# Patient Record
Sex: Female | Born: 1935 | Race: White | Hispanic: No | Marital: Married | Smoking: Former smoker
Health system: Southern US, Community
[De-identification: ages and names within clinical notes are randomized; demographics above are authoritative.]

## PROBLEM LIST (undated history)

## (undated) DIAGNOSIS — K449 Diaphragmatic hernia without obstruction or gangrene: Secondary | ICD-10-CM

## (undated) DIAGNOSIS — K219 Gastro-esophageal reflux disease without esophagitis: Secondary | ICD-10-CM

## (undated) DIAGNOSIS — K589 Irritable bowel syndrome without diarrhea: Secondary | ICD-10-CM

## (undated) DIAGNOSIS — G4700 Insomnia, unspecified: Secondary | ICD-10-CM

## (undated) DIAGNOSIS — E042 Nontoxic multinodular goiter: Secondary | ICD-10-CM

## (undated) DIAGNOSIS — M47816 Spondylosis without myelopathy or radiculopathy, lumbar region: Secondary | ICD-10-CM

## (undated) HISTORY — DX: Nontoxic multinodular goiter: E04.2

## (undated) HISTORY — PX: TONSILLECTOMY AND ADENOIDECTOMY: SUR1326

## (undated) HISTORY — DX: Spondylosis without myelopathy or radiculopathy, lumbar region: M47.816

## (undated) HISTORY — PX: FOOT SURGERY: SHX648

## (undated) HISTORY — PX: SHOULDER SURGERY: SHX246

## (undated) HISTORY — PX: VESICOVAGINAL FISTULA CLOSURE W/ TAH: SUR271

## (undated) HISTORY — PX: APPENDECTOMY: SHX54

## (undated) HISTORY — DX: Insomnia, unspecified: G47.00

## (undated) HISTORY — DX: Irritable bowel syndrome without diarrhea: K58.9

## (undated) HISTORY — DX: Diaphragmatic hernia without obstruction or gangrene: K44.9

## (undated) HISTORY — DX: Gastro-esophageal reflux disease without esophagitis: K21.9

## (undated) HISTORY — PX: CHOLECYSTECTOMY: SHX55

## (undated) HISTORY — PX: BILATERAL SALPINGOOPHORECTOMY: SHX1223

---

## 1999-05-25 ENCOUNTER — Encounter: Admission: RE | Admit: 1999-05-25 | Discharge: 1999-08-23 | Payer: Self-pay | Admitting: Orthopedic Surgery

## 1999-07-08 ENCOUNTER — Encounter: Payer: Self-pay | Admitting: Orthopedic Surgery

## 1999-07-08 ENCOUNTER — Encounter: Admission: RE | Admit: 1999-07-08 | Discharge: 1999-07-08 | Payer: Self-pay | Admitting: Orthopedic Surgery

## 1999-09-28 ENCOUNTER — Encounter: Admission: RE | Admit: 1999-09-28 | Discharge: 1999-11-10 | Payer: Self-pay | Admitting: Orthopedic Surgery

## 2001-05-10 ENCOUNTER — Encounter: Admission: RE | Admit: 2001-05-10 | Discharge: 2001-05-10 | Payer: Self-pay | Admitting: Family Medicine

## 2001-05-10 ENCOUNTER — Encounter: Payer: Self-pay | Admitting: Family Medicine

## 2001-10-02 ENCOUNTER — Ambulatory Visit (HOSPITAL_COMMUNITY): Admission: RE | Admit: 2001-10-02 | Discharge: 2001-10-02 | Payer: Self-pay | Admitting: Gastroenterology

## 2002-03-16 ENCOUNTER — Emergency Department (HOSPITAL_COMMUNITY): Admission: EM | Admit: 2002-03-16 | Discharge: 2002-03-16 | Payer: Self-pay | Admitting: Emergency Medicine

## 2002-03-16 ENCOUNTER — Encounter: Payer: Self-pay | Admitting: Emergency Medicine

## 2002-03-20 ENCOUNTER — Encounter: Payer: Self-pay | Admitting: Surgery

## 2002-03-26 ENCOUNTER — Encounter: Payer: Self-pay | Admitting: Surgery

## 2002-03-26 ENCOUNTER — Ambulatory Visit (HOSPITAL_COMMUNITY): Admission: RE | Admit: 2002-03-26 | Discharge: 2002-03-27 | Payer: Self-pay | Admitting: Surgery

## 2002-03-26 ENCOUNTER — Encounter (INDEPENDENT_AMBULATORY_CARE_PROVIDER_SITE_OTHER): Payer: Self-pay | Admitting: *Deleted

## 2002-06-08 ENCOUNTER — Encounter: Payer: Self-pay | Admitting: Orthopedic Surgery

## 2002-06-08 ENCOUNTER — Ambulatory Visit (HOSPITAL_COMMUNITY): Admission: RE | Admit: 2002-06-08 | Discharge: 2002-06-08 | Payer: Self-pay | Admitting: Orthopedic Surgery

## 2003-01-31 ENCOUNTER — Encounter: Admission: RE | Admit: 2003-01-31 | Discharge: 2003-01-31 | Payer: Self-pay | Admitting: Internal Medicine

## 2003-02-27 ENCOUNTER — Encounter: Admission: RE | Admit: 2003-02-27 | Discharge: 2003-03-11 | Payer: Self-pay | Admitting: Neurosurgery

## 2006-01-03 ENCOUNTER — Encounter: Admission: RE | Admit: 2006-01-03 | Discharge: 2006-04-03 | Payer: Self-pay | Admitting: Family Medicine

## 2006-01-16 ENCOUNTER — Emergency Department (HOSPITAL_COMMUNITY): Admission: EM | Admit: 2006-01-16 | Discharge: 2006-01-16 | Payer: Self-pay | Admitting: Emergency Medicine

## 2009-10-07 ENCOUNTER — Encounter: Payer: Self-pay | Admitting: Pulmonary Disease

## 2009-10-21 ENCOUNTER — Encounter: Admission: RE | Admit: 2009-10-21 | Discharge: 2009-10-21 | Payer: Self-pay | Admitting: Family Medicine

## 2009-10-30 DIAGNOSIS — M5430 Sciatica, unspecified side: Secondary | ICD-10-CM | POA: Insufficient documentation

## 2009-10-30 DIAGNOSIS — K449 Diaphragmatic hernia without obstruction or gangrene: Secondary | ICD-10-CM | POA: Insufficient documentation

## 2009-10-30 DIAGNOSIS — M545 Low back pain, unspecified: Secondary | ICD-10-CM | POA: Insufficient documentation

## 2009-10-30 DIAGNOSIS — J309 Allergic rhinitis, unspecified: Secondary | ICD-10-CM | POA: Insufficient documentation

## 2009-10-30 DIAGNOSIS — G4700 Insomnia, unspecified: Secondary | ICD-10-CM | POA: Insufficient documentation

## 2009-10-30 DIAGNOSIS — K589 Irritable bowel syndrome without diarrhea: Secondary | ICD-10-CM | POA: Insufficient documentation

## 2009-10-30 DIAGNOSIS — J45909 Unspecified asthma, uncomplicated: Secondary | ICD-10-CM | POA: Insufficient documentation

## 2009-10-30 DIAGNOSIS — K591 Functional diarrhea: Secondary | ICD-10-CM | POA: Insufficient documentation

## 2009-10-30 DIAGNOSIS — E559 Vitamin D deficiency, unspecified: Secondary | ICD-10-CM | POA: Insufficient documentation

## 2009-10-30 DIAGNOSIS — K219 Gastro-esophageal reflux disease without esophagitis: Secondary | ICD-10-CM | POA: Insufficient documentation

## 2009-10-30 DIAGNOSIS — K224 Dyskinesia of esophagus: Secondary | ICD-10-CM | POA: Insufficient documentation

## 2009-10-31 ENCOUNTER — Ambulatory Visit: Payer: Self-pay | Admitting: Pulmonary Disease

## 2009-10-31 DIAGNOSIS — E041 Nontoxic single thyroid nodule: Secondary | ICD-10-CM | POA: Insufficient documentation

## 2009-10-31 LAB — CONVERTED CEMR LAB
BUN: 19 mg/dL (ref 6–23)
CO2: 32 meq/L (ref 19–32)
Calcium: 9.4 mg/dL (ref 8.4–10.5)
GFR calc non Af Amer: 62.39 mL/min (ref >60)
Glucose, Bld: 126 mg/dL — ABNORMAL HIGH (ref 70–99)
Potassium: 4.1 meq/L (ref 3.5–5.1)

## 2009-11-03 ENCOUNTER — Ambulatory Visit: Payer: Self-pay | Admitting: Cardiology

## 2010-04-15 ENCOUNTER — Other Ambulatory Visit: Payer: Self-pay | Admitting: Pulmonary Disease

## 2010-04-15 DIAGNOSIS — R918 Other nonspecific abnormal finding of lung field: Secondary | ICD-10-CM

## 2010-04-21 NOTE — Assessment & Plan Note (Signed)
Summary: pulm nodule/apc   Visit Type:  Initial Consult Copy to:  Dr. Catha Gosselin Primary Provider/Referring Provider:  Dr. Catha Gosselin  CC:  Pulmonary consult for abnormal CT chest. Pulmonary nodules seen on scan.Marland Kitchen  History of Present Illness: 77 yo female with abnormal CT chest.  She was visiting her daughter in [place] in June.  She fell while there, and as a result was taken to [facility].  During that evaluation she was found to have a lung nodule.  She was advised to have further evaluation at that time, but deferred until she was able to return to [place].  She was also found to have multiple thyroid nodules.  She has a history of asthma and allergies.  She had an asthma attack 8 years ago while in [place].  Otherwise she does not currently have any symptoms of cough, wheeze, sputum, hemoptysis, fever, chest pain, or palpitations.  She uses flonase, and this has controlled her allergy symptoms.  She has trouble with her allergies in the Spring and Fall.  She will sometimes get wheeze with a flare of her allergies.  She will get welts if she takes penicillin.  There is no prior history of pneumonia, and she only seldomly gets bronchitis.  There is no history of TB.  She smoked less than a pack of cigarettes for about 10 years, and quit in 1962.  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  She travelled to the [place] last year, but had no trouble.  She has not had any recent sick exposures.  She is originally from [place], but has been living in [place] for 22 years.  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  CT chest from June 2011 showed left upper lobe cavitary nodule 10 mm x 10 mm, and several areas of ground glass attentuation.  Preventive Screening-Counseling & Management  Alcohol-Tobacco     Alcohol drinks/day: <1     Alcohol type: wine     Smoking Status: quit     Packs/Day: 0.75   Year Started: 1952     Year Quit: 1968     Pack years: 12  Current Medications (verified): 1)  Vitamin D 1000 Unit Tabs (Cholecalciferol) .... 2 By Mouth Daily 2)  Fexofenadine Hcl 180 Mg Tabs (Fexofenadine Hcl) .... Take 1 Tablet By Mouth Once A Day 3)  Fluticasone Propionate 50 Mcg/act Susp (Fluticasone Propionate) .... 2 Sprays Each Nostril Dialy 4)  Cholestyramine 4 Gm Pack (Cholestyramine) .... Mix 1 Packet in Beverage and Drink Once Daily Per Dr. Randa Evens 5)  Calcium 1500 Mg Tabs (Calcium Carbonate) .Marland Kitchen.. 1 By Mouth Daily 6)  Multivitamins  Tabs (Multiple Vitamin) .Marland Kitchen.. 1 By Mouth Daily 7)  Restoril 15 Mg Caps (Temazepam) .Marland Kitchen.. 1 By Mouth At Bedtime As Needed 8)  Aleve 220 Mg Tabs (Naproxen Sodium) .... As Needed  Allergies (verified): 1)  ! Codeine 2)  ! Penicillin 3)  ! Jonne Ply  Past History:  Past Medical History: Asthma Allergic rhinitis Irritable bowel syndrome GERD HIatal hernia Lumbar spine spondylosis Insomnia Multinodular thyroid Vitamin D Deficiency Pulmonary nodule Lt upper lobe      - CT chest June 2011     Past Surgical History: Appendectomy Cholecystectomy Tonsillectomy and adenoidectomy Right foot surgery BSO for fibroids Benign breast biopsy x2 Shoulder surgery Hysterectomy  Family History: Family History Emphysema ---father and brother  Social History: former smoker.  social alcohol retired Runner, broadcasting/film/video married 3 childrenAlcohol drinks/day:  <1 Smoking Status:  quit Packs/Day:  0.75 Pack years:  12  Review of Systems  The patient denies shortness of breath with activity, shortness of breath at rest, productive cough, non-productive cough, coughing up blood, chest pain, irregular heartbeats, acid heartburn, indigestion, loss of appetite, weight change, abdominal pain, difficulty swallowing, sore throat, tooth/dental problems, headaches, nasal congestion/difficulty breathing through nose, sneezing, itching, ear ache, anxiety, depression, hand/feet  swelling, joint stiffness or pain, rash, change in color of mucus, and fever.    Vital Signs:  Patient profile:   77 year old female Height:      65 inches (165.10 cm) Weight:      178 pounds (80.91 kg) BMI:     29.73 O2 Sat:      97 % on Room air Temp:     98.5 degrees F (36.94 degrees C) p Pulse rate:   91 / minute BP sitting:   142 / 90  (right arm) Cuff size:   regular  Vitals Entered By: Michel Bickers CMA (October 31, 2009 2:38 PM)  O2 Sat at Rest %:  97 O2 Flow:  Room air CC: Pulmonary consult for abnormal CT chest. Pulmonary nodules seen on scan. Is Patient Diabetic? No Comments Medications reviewed with the patient. Daytime phone verified. Michel Bickers [facility]  October 31, 2009 2:39 PM   Physical Exam  General:  normal appearance and healthy appearing.   Eyes:  PERRLA and EOMI, wears glasses Nose:  no deformity, discharge, inflammation, or lesions Mouth:  no deformity or lesions Neck:  no JVD.   Chest Wall:  no deformities noted Lungs:  clear bilaterally to auscultation and percussion Heart:  regular rate and rhythm, S1, S2 without murmurs, rubs, gallops, or clicks Abdomen:  bowel sounds positive; abdomen soft and non-tender without masses, or organomegaly Msk:  no deformity or scoliosis noted with normal posture Pulses:  pulses normal Extremities:  no clubbing, cyanosis, edema, or deformity noted Neurologic:  CN II-XII grossly intact with normal reflexes, coordination, muscle strength and tone Cervical Nodes:  no significant adenopathy Axillary Nodes:  no significant adenopathy   Impression & Recommendations:  Problem # 1:  CT, CHEST, ABNORMAL (ICD-793.1) She had incidental finding of multiple nodules and left upper lobe cavitary lesion on CT chest from June 2011 while in [place].  She has no respiratory symptoms to speak of.  She has very remote history of tobacco use.  I will repeat her CT chest with contrast.  Will call her with the results.  Explained that if her  findings persist or have progressed, that she will need to undergo lung tissue and airway sampling.  Problem # 2:  THYROID NODULE (ICD-241.0) She is to have further evaluation of this.  Medications Added to Medication List This Visit: 1)  Restoril 15 Mg Caps (Temazepam) .Marland Kitchen.. 1 by mouth at bedtime as needed 2)  Vitamin D 1000 Unit Tabs (Cholecalciferol) .... 2 by mouth daily 3)  Calcium 1500 Mg Tabs (Calcium carbonate) .Marland Kitchen.. 1 by mouth daily 4)  Multivitamins Tabs (Multiple vitamin) .Marland Kitchen.. 1 by mouth daily 5)  Aleve 220 Mg Tabs (Naproxen sodium) .... As needed  Complete Medication List: 1)  Fexofenadine Hcl 180 Mg Tabs (Fexofenadine hcl) .... Take 1 tablet by mouth once a day 2)  Fluticasone Propionate 50 Mcg/act Susp (Fluticasone propionate) .... 2 sprays each nostril dialy 3)  Cholestyramine 4 Gm Pack (Cholestyramine) .... Mix 1 packet in beverage and  drink once daily per dr. Randa Evens 4)  Restoril 15 Mg Caps (Temazepam) .Marland Kitchen.. 1 by mouth at bedtime as needed 5)  Vitamin D 1000 Unit Tabs (Cholecalciferol) .... 2 by mouth daily 6)  Calcium 1500 Mg Tabs (Calcium carbonate) .Marland Kitchen.. 1 by mouth daily 7)  Multivitamins Tabs (Multiple vitamin) .Marland Kitchen.. 1 by mouth daily 8)  Aleve 220 Mg Tabs (Naproxen sodium) .... As needed  Other Orders: Consultation Level IV (04540) Radiology Referral (Radiology) TLB-BMP (Basic Metabolic Panel-BMET) (80048-METABOL)  Patient Instructions: 1)  Will schedule CT chest 2)  Follow up in 2 weeks

## 2010-04-21 NOTE — Letter (Signed)
Summary: [facility]   Imported By: Sherian Rein 11/17/2009 11:39:30  _____________________________________________________________________  External Attachment:    Type:   Image     Comment:   External Document

## 2010-05-07 ENCOUNTER — Other Ambulatory Visit: Payer: Self-pay | Admitting: Internal Medicine

## 2010-05-07 DIAGNOSIS — E042 Nontoxic multinodular goiter: Secondary | ICD-10-CM

## 2010-05-11 ENCOUNTER — Ambulatory Visit
Admission: RE | Admit: 2010-05-11 | Discharge: 2010-05-11 | Payer: Self-pay | Source: Ambulatory Visit | Attending: Internal Medicine | Admitting: Internal Medicine

## 2010-05-11 ENCOUNTER — Ambulatory Visit (INDEPENDENT_AMBULATORY_CARE_PROVIDER_SITE_OTHER)
Admission: RE | Admit: 2010-05-11 | Discharge: 2010-05-11 | Disposition: A | Discharge status: Home / Self Care | Payer: Medicare Other | Source: Ambulatory Visit | Attending: Pulmonary Disease | Admitting: Pulmonary Disease

## 2010-05-11 DIAGNOSIS — R918 Other nonspecific abnormal finding of lung field: Secondary | ICD-10-CM

## 2010-05-20 ENCOUNTER — Encounter: Payer: Self-pay | Admitting: Pulmonary Disease

## 2010-05-20 ENCOUNTER — Other Ambulatory Visit: Payer: Self-pay | Admitting: Pulmonary Disease

## 2010-05-20 ENCOUNTER — Ambulatory Visit (INDEPENDENT_AMBULATORY_CARE_PROVIDER_SITE_OTHER): Payer: Medicare Other | Admitting: Pulmonary Disease

## 2010-05-20 DIAGNOSIS — J984 Other disorders of lung: Secondary | ICD-10-CM | POA: Insufficient documentation

## 2010-05-20 DIAGNOSIS — R911 Solitary pulmonary nodule: Secondary | ICD-10-CM

## 2010-05-28 NOTE — Assessment & Plan Note (Signed)
Summary: rov/ct cxr/kp   Visit Type:  Follow-up Copy to:  Dr. Catha Gosselin Primary Provider/Referring Provider:  Dr. Catha Gosselin  CC:  follow up. Pt here to go over ct results. Pt has no concerns today.  History of Present Illness: 77 yo female with pulmonary nodule.  She has more allergy symptoms which is typical for this time of year.  She denies cough, hemoptysis, fever, weight loss, sweats, or gland swelling.  CT chest from May 11, 2010 as detailed below.  CT of Chest  Procedure date:  05/11/2010  Findings:      CT CHEST WITHOUT CONTRAST   Technique:  Multidetector CT imaging of the chest was performed following the standard protocol without IV contrast.   Comparison: 11/03/2009   Findings: 0.9 cm pretracheal node is stable.  Heart size is normal. No pericardial or pleural effusion.  Great vessels are normal in caliber.  No axillary lymphadenopathy.   Right apical nodule is larger, now 1.1 cm on image 6, previously 0.6 cm.   Partially cavitary left upper lobe nodule now measures 1.0 x 0.9 cm on image 8, unchanged.   Mild emphysematous changes are noted.   Ground-glass airspace opacity nodule the right middle lobe measures 0.6 cm on image 26, possibly smaller than previously, although this may be in part due to differences in technique.   New patchy areas of subpleural nodularity are noted in the superior segments of the bilateral lower lobes, for example image 29.  Sub centimeter right lower lobe ground-glass airspace opacity nodule image 34 is stable.   Other scattered predominately subpleural and/or peripheral areas of ill-defined sub centimeter nodularity with surrounding ground-glass opacity are again noted.  One area of nodularity in the left lower lobe measuring 0.5 cm on image 48 is new or increased.   No lytic or sclerotic osseous lesion.  No new osseous abnormality.   IMPRESSION: Increase in size of irregular right upper lobe/apical  pulmonary nodular opacity.  Overall, there are other areas of predominately subpleural nodularity that are new and increased since the prior exam, many of which demonstrate surrounding ground-glass opacity. This suggests etiologies such as infection, septic emboli, or vasculitis.  However, the increase in size of the right apical nodule is also concerning for malignancy.  PET CT is recommended for further evaluation.   Current Medications (verified): 1)  Fexofenadine Hcl 180 Mg Tabs (Fexofenadine Hcl) .... Take 1 Tablet By Mouth Once A Day 2)  Fluticasone Propionate 50 Mcg/act Susp (Fluticasone Propionate) .... 2 Sprays Each Nostril Dialy 3)  Cholestyramine 4 Gm Pack (Cholestyramine) .... Mix 1 Packet in Beverage and Drink Once Daily Per Dr. Randa Evens 4)  Vitamin D 1000 Unit Tabs (Cholecalciferol) .... 2 By Mouth Daily 5)  Calcium 1500 Mg Tabs (Calcium Carbonate) .Marland Kitchen.. 1 By Mouth Daily 6)  Multivitamins  Tabs (Multiple Vitamin) .Marland Kitchen.. 1 By Mouth Daily 7)  Aleve 220 Mg Tabs (Naproxen Sodium) .... As Needed  Allergies (verified): 1)  ! Codeine 2)  ! Penicillin 3)  ! Jonne Ply  Past History:  Past Medical History: Asthma Allergic rhinitis Irritable bowel syndrome GERD HIatal hernia Lumbar spine spondylosis Insomnia Multinodular thyroid Vitamin D Deficiency Pulmonary nodules      - CT chest June 2011, August 2011, February 2012      Past Surgical History: Reviewed history from 10/31/2009 and no changes required. Appendectomy Cholecystectomy Tonsillectomy and adenoidectomy Right foot surgery BSO for fibroids Benign breast biopsy x2 Shoulder surgery Hysterectomy  Social History: former smoker. Quit  37.  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 Pt here to go over ct results. Pt has no concerns today Comments meds and allergies updated Phone number updated Carver Fila  May 20, 2010 9:50 AM    Physical Exam  General:  normal appearance and healthy appearing.   Nose:  no deformity, discharge, inflammation, or lesions Mouth:  no deformity or lesions Neck:  no JVD.   Lungs:  clear bilaterally to auscultation and percussion Heart:  regular rate and rhythm, S1, S2 without murmurs, rubs, gallops, or clicks Extremities:  no clubbing, cyanosis, edema, or deformity noted Neurologic:  normal CN II-XII.   Cervical Nodes:  no significant adenopathy   Impression & Recommendations:  Problem # 1:  PULMONARY NODULE (ICD-518.89) She has progression of right upper lobe nodule.  Will arrange for PET scan to further assess.  Depending on results will decide if she needs continue radiographic observation vs. biopsy attempt.  Problem # 2:  ALLERGIC RHINITIS CAUSE UNSPECIFIED (ICD-477.9) She is to follow up with primary care.  Complete Medication List: 1)  Fexofenadine Hcl 180 Mg Tabs (Fexofenadine hcl) .... Take 1 tablet by mouth once a day 2)  Fluticasone Propionate 50 Mcg/act Susp (Fluticasone propionate) .... 2 sprays each nostril dialy 3)  Cholestyramine 4 Gm Pack (Cholestyramine) .... Mix 1 packet in beverage and drink once daily per dr. Randa Evens 4)  Vitamin D 1000 Unit Tabs (Cholecalciferol) .... 2 by mouth daily 5)  Calcium 1500 Mg Tabs (Calcium carbonate) .Marland Kitchen.. 1 by mouth daily 6)  Multivitamins Tabs (Multiple vitamin) .Marland Kitchen.. 1 by mouth daily 7)  Aleve 220 Mg Tabs (Naproxen sodium) .... As needed  Other Orders: Est. Patient Level IV (53664) Radiology Referral (Radiology)  Patient Instructions: 1)  Will schedule PET scan; will call with results 2)  Follow up in 3  months   Immunization History:  Influenza Immunization History:    Influenza:  historical (11/20/2009)  Pneumovax Immunization History:    Pneumovax:  historical (12/21/2003)

## 2010-06-02 ENCOUNTER — Encounter (HOSPITAL_COMMUNITY)
Admission: RE | Admit: 2010-06-02 | Discharge: 2010-06-02 | Disposition: A | Discharge status: Home / Self Care | Payer: Medicare Other | Source: Ambulatory Visit | Attending: Pulmonary Disease | Admitting: Pulmonary Disease

## 2010-06-02 DIAGNOSIS — R911 Solitary pulmonary nodule: Secondary | ICD-10-CM

## 2010-06-02 DIAGNOSIS — J984 Other disorders of lung: Secondary | ICD-10-CM | POA: Insufficient documentation

## 2010-06-02 MED ORDER — FLUDEOXYGLUCOSE F - 18 (FDG) INJECTION
16.1000 | Freq: Once | INTRAVENOUS | Status: AC | PRN
  Administered 2010-06-02: 16.1 via INTRAVENOUS

## 2010-06-05 ENCOUNTER — Other Ambulatory Visit: Payer: Self-pay | Admitting: Pulmonary Disease

## 2010-06-05 DIAGNOSIS — R911 Solitary pulmonary nodule: Secondary | ICD-10-CM

## 2010-08-07 NOTE — Op Note (Signed)
[place]  Patient:    Kristen Hart, Kristen Hart Visit Number: [id number] MRN: [id number]          Service Type: END Location: ENDO Attending Physician:  Orland Mustard Dictated by:   Llana Aliment. Randa Evens, M.D. Proc. Date: 10/02/01 Admit Date:  10/02/2001   CC:         Caryn Bee C. Sydnee Levans, M.D.   Operative Report  DATE OF BIRTH:  05-13-1927  PROCEDURE:  Colonoscopy.  MEDICATIONS:  Fentanyl 100 mcg, Versed 7 mg IV.  INDICATIONS:  Colon cancer screening.  INSTRUMENT:  Pediatric video colonoscope.  DESCRIPTION OF PROCEDURE:  The patient had the procedure explained to them and consent obtained. The patient in the left lateral decubitus position. The scope was inserted under direct visualization. There was extensive diverticula disease in the sigmoid colon. Multiple maneuvers were required to pass this. Eventually, we were able to pass it. The patient had had a brief period of abdominal pain and a "passing out spell" at home during the prep. We had checked labs in the endoscopic unit and CBC and BMET were all normal. Once we were able to pass this, we were able to advance fairly rapidly to the cecum. The ileocecal valve and appendiceal orifice were seen. The scope was withdrawn and the cecum, ascending colon, hepatic flexure, transverse colon, splenic flexure, descending and sigmoid colon were seen well upon removal. No polyps were seen throughout. Again, extensive diverticular disease seen in the sigmoid colon. The rectum was free of polyps. The scope was withdrawn. The patient tolerated the procedure well, was maintained on low-flow oxygen and pulse oximeter throughout the procedure.  ASSESSMENT: Severe diverticulosis of the sigmoid colon, probably the cause of her cramping during the prep.  PLAN:  Routine follow up with yearly Hemoccults. Consider another colon screen of some sort in 5-10 years. Will give information about diverticular  disease, fiber supplements, etc. Dictated by:   Llana Aliment. Randa Evens, M.D. Attending Physician:  Orland Mustard DD:  10/02/01 TD:  10/03/01 Job: [id number]

## 2010-08-07 NOTE — Op Note (Signed)
NAME:  Kristen Hart, Kristen Hart NO.:  [id number]   MEDICAL RECORD NO.:  [id number]                    PATIENT TYPE:   LOCATION:                                       FACILITY:   PHYSICIAN:  Thornton Park. Daphine Deutscher, M.D.             DATE OF BIRTH:  11/06/1927   DATE OF PROCEDURE:  03/26/2002  DATE OF DISCHARGE:                                 OPERATIVE REPORT   PREOPERATIVE DIAGNOSIS:  Large gallstone with chronic cholecystitis.   POSTOPERATIVE DIAGNOSIS:  Large gallstone with subacute and chronic  cholecystitis.   PROCEDURE:  Laparoscopic cholecystectomy with intraoperative cholangiogram.   SURGEON:  Thornton Park. Daphine Deutscher, M.D.   ASSISTANT:  Sandria Bales. Ezzard Standing, M.D.   ANESTHESIA:  General endotracheal anesthesia.   DESCRIPTION OF PROCEDURE:  The patient is a 77 year-old lady with the  aforementioned problems. She was taken back to room 16 and given general  anesthesia.  The abdomen was prepped with Betadine and draped sterilely. A  longitudinal incision was made down into her umbilicus and through  pursestring suture the Hasson cannula was passed  and the abdomen was  insufflated.  Three trocars were placed in the upper abdomen.  The  gallbladder was decompressed because it was so distended and the bile was  sent for culture and gram stain.  The gallbladder was really stuck  particularly down near the infundibulum and this was stripped away with  blunt dissection using the dissector and the suction irrigator.  I then  dissected free the cystic duct and put a clamp upon the gallbladder side,  opened it and inserted a Reddick catheter.  It went in 2 cm into what  appeared to be the common duct where I blew up the balloon and pulled it  back.  Dynamic cholangiogram confirmed that the balloon was in the common  duct and I got preferential flow upstream and then when I pulled it back a  little bit with the balloon inflated I got good downstream filling as well.  This  corroborated the clinical impression of the length of the cystic duct  and I went ahead and then removed the catheter, tripped clipped it and then  divided it.  The cystic artery was divided after clipping and then the  gallbladder was removed with some bluntness and then with the Bovie from a  very intrahepatic position and from a very inflamed position chronically.  The gallbladder when once detached and was then placed in a bag and brought  out through the umbilicus.  In the meantime, I inspected the gallbladder bed  and no bleeding or bowel leaks were seen.  I had to enlarge both the fascial  and skin incisions to remove this probably greater than one and a half inch  gallstone.  The umbilical defect was then repaired with interrupted sutures  of 0 Prolene.  The wounds were irrigated.  They had been injected with  some  lidocaine/Marcaine and once the instillation had occurred the skin was  closed with 4-0 Vicryl.  Prior to removing, I did inspect the closure from  inside with the  scope and there was a good secure closure without impinging on any other  structures.  The other port sites were removed and the abdomen was deflated.  The patient seemed to tolerate this procedure well.  She was taken to the  recovery room in satisfactory condition prior to admission for observation.                                               Thornton Park Daphine Deutscher, M.D.    MBM/MEDQ  D:  03/26/2002  T:  03/26/2002  Job:  [id number]   cc:   Fayrene Fearing L. Malon Kindle., M.D.  [address], [address]  [place]  [place] [address]  Fax: [phone]

## 2010-09-17 ENCOUNTER — Other Ambulatory Visit: Payer: Self-pay | Admitting: Internal Medicine

## 2010-09-17 DIAGNOSIS — E042 Nontoxic multinodular goiter: Secondary | ICD-10-CM

## 2010-09-28 ENCOUNTER — Ambulatory Visit
Admission: RE | Admit: 2010-09-28 | Discharge: 2010-09-28 | Disposition: A | Discharge status: Home / Self Care | Payer: Medicare Other | Source: Ambulatory Visit | Attending: Internal Medicine | Admitting: Internal Medicine

## 2010-09-28 DIAGNOSIS — E042 Nontoxic multinodular goiter: Secondary | ICD-10-CM

## 2010-10-07 ENCOUNTER — Ambulatory Visit (INDEPENDENT_AMBULATORY_CARE_PROVIDER_SITE_OTHER)
Admission: RE | Admit: 2010-10-07 | Discharge: 2010-10-07 | Disposition: A | Discharge status: Home / Self Care | Payer: Medicare Other | Source: Ambulatory Visit | Attending: Pulmonary Disease | Admitting: Pulmonary Disease

## 2010-10-07 DIAGNOSIS — J984 Other disorders of lung: Secondary | ICD-10-CM

## 2010-10-07 DIAGNOSIS — R911 Solitary pulmonary nodule: Secondary | ICD-10-CM

## 2010-10-13 ENCOUNTER — Telehealth: Payer: Self-pay | Admitting: Pulmonary Disease

## 2010-10-13 DIAGNOSIS — J984 Other disorders of lung: Secondary | ICD-10-CM

## 2010-10-13 NOTE — Telephone Encounter (Signed)
CT chest 10/07/10>>Decreased size LUL nodule, scattered GGO stable.  Will have my nurse schedule ROV to review CT chest results.

## 2010-10-20 NOTE — Telephone Encounter (Signed)
Pt is coming in 8/6 at 1:30 to discuss results

## 2010-10-26 ENCOUNTER — Ambulatory Visit (INDEPENDENT_AMBULATORY_CARE_PROVIDER_SITE_OTHER): Payer: Medicare Other | Admitting: Pulmonary Disease

## 2010-10-26 ENCOUNTER — Encounter: Payer: Self-pay | Admitting: Pulmonary Disease

## 2010-10-26 VITALS — BP 114/78 | HR 81 | Temp 98.6°F | Ht 66.0 in | Wt 186.0 lb

## 2010-10-26 DIAGNOSIS — J984 Other disorders of lung: Secondary | ICD-10-CM

## 2010-10-26 NOTE — Progress Notes (Signed)
  Subjective:    Patient ID: Kristen Hart, female    DOB: March 18, 1928, 77 y.o.   MRN: [id number]  HPI 77 yo female with remote history of smoking with pulmonary nodule.  She had CT chest on October 07, 2010.  She has been doing well.  She denies cough, wheeze, sputum, chest pain, fever, hemoptysis, sore throat, difficulty swallowing, or gland swelling.  Review of Systems     Objective:   Physical Exam  BP 114/78  Pulse 81  Temp(Src) 98.6 F (37 C) (Oral)  Ht 5\' 6"  (1.676 m)  Wt 186 lb (84.369 kg)  BMI 30.02 kg/m2  SpO2 96%  General - Healthy HEENT - no sinus tenderness, no oral lesion, no LAN Cardiac - s1s2 regular Chest - CTA Abd - soft, nontender Ext - no edema Neuro - normal strength, CN intact Psych - normal mood, behavior   CT CHEST WITHOUT CONTRAST 10/07/10:  Technique: Multidetector CT imaging of the chest was performed  following the standard protocol without IV contrast.   Comparison: PET CT 06/02/2010, CT chest 05/11/2010 and CT chest  11/03/2009.   Findings: The thyroid is somewhat heterogeneous in attenuation. A  lymph node anterior to the right mainstem bronchus measures 11 mm,  and is likely stable. Hilar regions are difficult to definitively  evaluate without IV contrast. No axillary adenopathy.  Atherosclerotic calcification of the arterial vasculature. Heart  size normal. No pericardial effusion.  A nodular lesion at the apex of the right upper lobe has associated  calcification and fat density within, indicative of scarring. A  tiny nodule at the apex of the left upper lobe measures 5 mm (image  6) and appears less prominent than on baseline examination of  11/03/2009. Scattered centrilobular emphysema. Scattered  peribronchovascular nodularity is seen bilaterally and is likely  post infectious in etiology. There are a few additional scattered  areas of nodularity, namely, in the posterior segment right upper  lobe (image 24), measuring 7  mm, stable from 11/03/2009. No  pleural fluid. Airway is unremarkable.  Incidental imaging of the upper abdomen shows no acute findings.  No worrisome lytic or sclerotic lesions. There are degenerative  changes in the spine.   IMPRESSION:  1. Nodular density at the apex of the right upper lobe has  associated calcification and fat, favoring scarring.  2. Nodular lesion at the apex of the left upper lobe is less  prominent than baseline examination of 11/03/2009.  3. Additional scattered areas of ground-glass nodularity appear  stable from 11/03/2009.  4. Additional follow-up could be performed in 12 months to ensure  stability, as clinically indicated.     Assessment & Plan:

## 2010-10-26 NOTE — Patient Instructions (Signed)
Will schedule CT chest for August 2013. Will follow up in August 2013 after CT chest is done.

## 2010-10-26 NOTE — Assessment & Plan Note (Signed)
She is followed for pulmonary nodules, with predominant lesions in Lt and Rt upper lobes.  This was first seen November 03, 2009.  This has been stable on CT chest.  Will repeat CT chest w/o contrast for August 2013.

## 2011-03-12 ENCOUNTER — Emergency Department (HOSPITAL_BASED_OUTPATIENT_CLINIC_OR_DEPARTMENT_OTHER)
Admission: EM | Admit: 2011-03-12 | Discharge: 2011-03-12 | Disposition: A | Discharge status: Home / Self Care | Payer: Medicare Other | Attending: Emergency Medicine | Admitting: Emergency Medicine

## 2011-03-12 ENCOUNTER — Emergency Department (INDEPENDENT_AMBULATORY_CARE_PROVIDER_SITE_OTHER): Payer: Medicare Other

## 2011-03-12 ENCOUNTER — Encounter (HOSPITAL_BASED_OUTPATIENT_CLINIC_OR_DEPARTMENT_OTHER): Payer: Self-pay | Admitting: Family Medicine

## 2011-03-12 DIAGNOSIS — S0100XA Unspecified open wound of scalp, initial encounter: Secondary | ICD-10-CM | POA: Insufficient documentation

## 2011-03-12 DIAGNOSIS — S0990XA Unspecified injury of head, initial encounter: Secondary | ICD-10-CM | POA: Insufficient documentation

## 2011-03-12 DIAGNOSIS — S0003XA Contusion of scalp, initial encounter: Secondary | ICD-10-CM

## 2011-03-12 DIAGNOSIS — G319 Degenerative disease of nervous system, unspecified: Secondary | ICD-10-CM | POA: Insufficient documentation

## 2011-03-12 DIAGNOSIS — S098XXA Other specified injuries of head, initial encounter: Secondary | ICD-10-CM

## 2011-03-12 DIAGNOSIS — K589 Irritable bowel syndrome without diarrhea: Secondary | ICD-10-CM | POA: Insufficient documentation

## 2011-03-12 DIAGNOSIS — S0190XA Unspecified open wound of unspecified part of head, initial encounter: Secondary | ICD-10-CM

## 2011-03-12 DIAGNOSIS — W1809XA Striking against other object with subsequent fall, initial encounter: Secondary | ICD-10-CM

## 2011-03-12 DIAGNOSIS — R51 Headache: Secondary | ICD-10-CM

## 2011-03-12 DIAGNOSIS — Y92009 Unspecified place in unspecified non-institutional (private) residence as the place of occurrence of the external cause: Secondary | ICD-10-CM | POA: Insufficient documentation

## 2011-03-12 DIAGNOSIS — S0101XA Laceration without foreign body of scalp, initial encounter: Secondary | ICD-10-CM

## 2011-03-12 DIAGNOSIS — Z79899 Other long term (current) drug therapy: Secondary | ICD-10-CM | POA: Insufficient documentation

## 2011-03-12 DIAGNOSIS — W19XXXA Unspecified fall, initial encounter: Secondary | ICD-10-CM | POA: Insufficient documentation

## 2011-03-12 DIAGNOSIS — J45909 Unspecified asthma, uncomplicated: Secondary | ICD-10-CM | POA: Insufficient documentation

## 2011-03-12 MED ORDER — HYDROCODONE-ACETAMINOPHEN 5-325 MG PO TABS
1.0000 | ORAL_TABLET | Freq: Once | ORAL | Status: AC
  Administered 2011-03-12: 1 via ORAL
  Filled 2011-03-12: qty 1

## 2011-03-12 MED ORDER — HYDROCODONE-ACETAMINOPHEN 5-500 MG PO TABS: 1.0000 | ORAL_TABLET | Freq: Every evening | ORAL | Status: AC | PRN

## 2011-03-12 MED ORDER — TETANUS-DIPHTH-ACELL PERTUSSIS 5-2.5-18.5 LF-MCG/0.5 IM SUSP
0.5000 mL | Freq: Once | INTRAMUSCULAR | Status: AC
  Administered 2011-03-12: 0.5 mL via INTRAMUSCULAR
  Filled 2011-03-12: qty 0.5

## 2011-03-12 NOTE — ED Provider Notes (Signed)
History     CSN: [id number]  Arrival date & time 03/12/11  0902   First MD Initiated Contact with Patient 03/12/11 [phone]      Chief Complaint  Patient presents with  . Fall  . Head Injury    (Consider location/radiation/quality/duration/timing/severity/associated sxs/prior treatment) HPI  83yof presents after fall. Patient states that she was involved in a relay activity today she fell forward and struck her head on the tile floor. She had approximately 1 minute of loss of consciousness. No confusion thereafter. No incontinence or seizure like activity. Denies headache, dizziness, cp, palpitations, shortness of breath pre fall and currently. No neck pain or back pain. Denies hip pain. C/O min bleeding from scalp laceration. Takes no anticoagulants.   ED Notes, ED Provider Notes from 03/12/11 0000 to 03/12/11 09:11:19       Vickie Boston Service, RN 03/12/2011 09:04      Pt from [facility]. Pt was in exercise class and "slipped" falling and hitting back of head. Bleeding controlled upon arrival. No Loc. Pt alert and oriented upon arrival.      Past Medical History  Diagnosis Date  . Asthma   . Allergic rhinitis   . IBS (irritable bowel syndrome)   . GERD (gastroesophageal reflux disease)   . Hiatal hernia   . Lumbar spondylosis   . Multinodular thyroid   . Insomnia     Past Surgical History  Procedure Date  . Appendectomy   . Cholecystectomy   . Tonsillectomy and adenoidectomy   . Foot surgery     right  . Bilateral salpingoophorectomy     fibroids  . Shoulder surgery   . Vesicovaginal fistula closure w/ tah     Family History  Problem Relation Age of Onset  . Emphysema Brother   . Emphysema Father     History  Substance Use Topics  . Smoking status: Former Smoker -- 1.0 packs/day for 8 years    Types: Cigarettes    Quit date: 03/23/1959  . Smokeless tobacco: Never Used  . Alcohol Use: Yes    OB History    Grav Para Term Preterm Abortions TAB SAB  Ect Mult Living                  Review of Systems  All other systems reviewed and are negative.   except as noted HPI  Allergies  Aspirin; Codeine; and Penicillins  Home Medications   Current Outpatient Rx  Name Route Sig Dispense Refill  . CALCIUM 1500 MG PO TABS Oral Take 1,500 mg by mouth daily.      Marland Kitchen VITAMIN D 1000 UNITS PO TABS Oral Take 2,000 Units by mouth daily.      . CHOLESTYRAMINE 4 G PO PACK Oral Take 1 packet by mouth daily.      Marland Kitchen FEXOFENADINE HCL 180 MG PO TABS Oral Take 180 mg by mouth daily.      Marland Kitchen FLUTICASONE PROPIONATE 50 MCG/ACT NA SUSP Nasal Place 2 sprays into the nose daily.      Marland Kitchen HYDROCODONE-ACETAMINOPHEN 5-500 MG PO TABS Oral Take 1 tablet by mouth at bedtime as needed for pain. 10 tablet 0  . ONE-DAILY MULTI VITAMINS PO TABS Oral Take 1 tablet by mouth daily.      Marland Kitchen NAPROXEN SODIUM 220 MG PO TABS Oral Take 220 mg by mouth as needed.        BP 136/66  Pulse 77  Temp(Src) 97.6 F (36.4 C) (Oral)  Resp  16  Ht 5\' 5"  (1.651 m)  Wt 165 lb (74.844 kg)  BMI 27.46 kg/m2  SpO2 98%  Physical Exam  Nursing note and vitals reviewed. Constitutional: She is oriented to person, place, and time. She appears well-developed.  HENT:  Head: Atraumatic.  Mouth/Throat: Oropharynx is clear and moist.  Eyes: Conjunctivae and EOM are normal. Pupils are equal, round, and reactive to light.  Neck: Normal range of motion. Neck supple.  Cardiovascular: Normal rate, regular rhythm, normal heart sounds and intact distal pulses.   Pulmonary/Chest: Effort normal and breath sounds normal. No respiratory distress. She has no wheezes. She has no rales.  Abdominal: Soft. She exhibits no distension. There is no tenderness. There is no rebound and no guarding.  Musculoskeletal: Normal range of motion.  Neurological: She is alert and oriented to person, place, and time.  Skin: Skin is warm and dry. No rash noted.       Scalp laceration star shaped 3cm total. Min bleeding    Psychiatric: She has a normal mood and affect.    ED Course  LACERATION REPAIR Date/Time: 03/12/2011 11:29 AM Performed by: Forbes Cellar Authorized by: Forbes Cellar Consent: Verbal consent obtained. Consent given by: patient Patient understanding: patient states understanding of the procedure being performed Patient consent: the patient's understanding of the procedure matches consent given Imaging studies: imaging studies available Patient identity confirmed: arm band Time out: Immediately prior to procedure a "time out" was called to verify the correct patient, procedure, equipment, support staff and site/side marked as required. Body area: head/neck Location details: scalp Laceration length: 3 cm Foreign bodies: no foreign bodies Tendon involvement: none Nerve involvement: none Vascular damage: no Patient sedated: no Irrigation solution: saline Amount of cleaning: standard Skin closure: staples Number of sutures: 4 Patient tolerance: Patient tolerated the procedure well with no immediate complications. Comments: No active bleeding post procedure   (including critical care time)  Labs Reviewed - No data to display Ct Head Wo Contrast  03/12/2011  *RADIOLOGY REPORT*  Clinical Data: Fall with a blow to the back of the head.  CT HEAD WITHOUT CONTRAST  Technique:  Contiguous axial images were obtained from the base of the skull through the vertex without contrast.  Comparison: None.  Findings: There is some cortical atrophy and chronic microvascular ischemic change.  No evidence of acute infarction, hemorrhage, mass lesion, mass effect, midline shift or abnormal extra-axial fluid collection.  Scalp hematoma posteriorly just below the vertex is noted.  There is no underlying fracture.  Postoperative change of right mastoidectomy noted.  IMPRESSION:  Scalp laceration and hematoma over the posterior calvarium near the vertex. No underlying fracture or acute intracranial  abnormality.  Original Report Authenticated By: Bernadene Bell. D'ALESSIO, M.D.     1. Fall   2. Blunt head injury   3. Scalp laceration     MDM  S/P mechanical fall with scalp laceration. No ICH/skull fx. Closed with staples. Tetanus updated here. Will f/u with her PMD for staple removal. Tylenol for pain, vicodin for breakthrough pain/ at bedtime. Precautions for return.        Forbes Cellar, MD 03/12/11 [phone]

## 2011-03-12 NOTE — ED Notes (Signed)
Pt from [facility]. Pt was in exercise class and "slipped" falling and hitting back of head. Bleeding controlled upon arrival. No Loc. Pt alert and oriented upon arrival.

## 2011-05-19 IMAGING — CT CT CHEST W/ CM
2 of 5 series · 15 of 36 positions shown, 18 images · IV contrast (Omnipaque 300)
Comparison: Outside CT scan [id number].

CLINICAL DATA: Follow-up left upper lobe lesion.

CT CHEST WITH CONTRAST
TECHNIQUE: Multidetector CT imaging of the chest was performed
following the standard protocol during bolus administration of
intravenous contrast.
Contrast: 80 ml Omnipaque 300

[Series 4: thins · axial · 0.77mm/px · z∈[-321,-35]mm · 12 of 397 slices shown, 15 images]
[im 20/397  mediastinal]
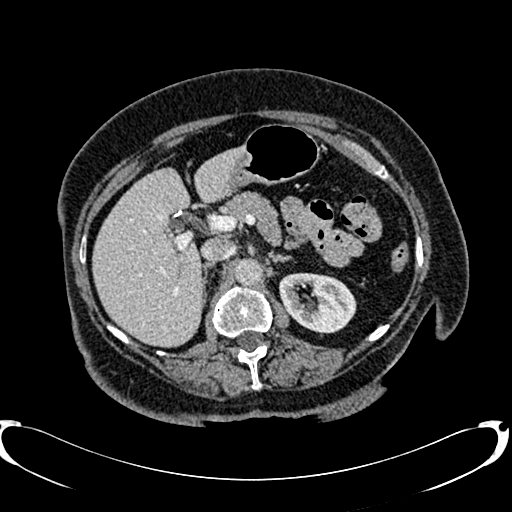
[im 20/397  lung]
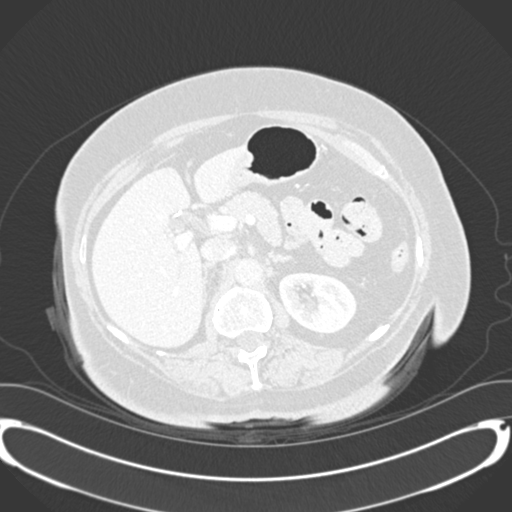
[im 60/397  lung]
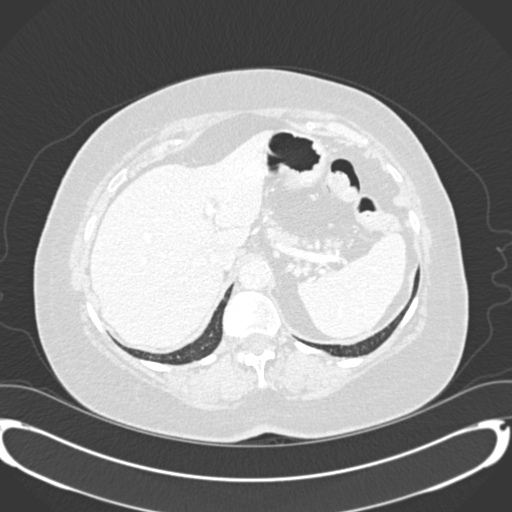
[im 80/397  lung]
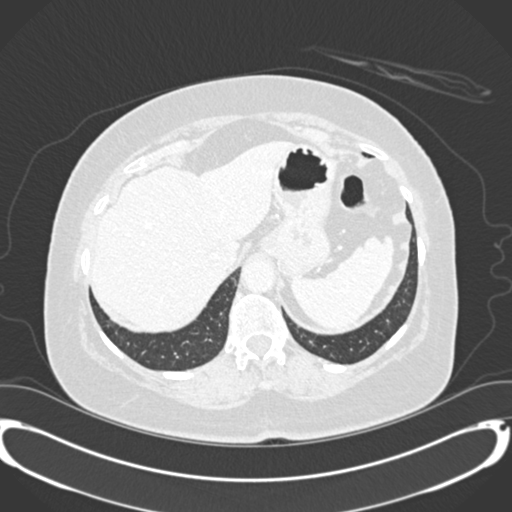
[im 119/397  lung]
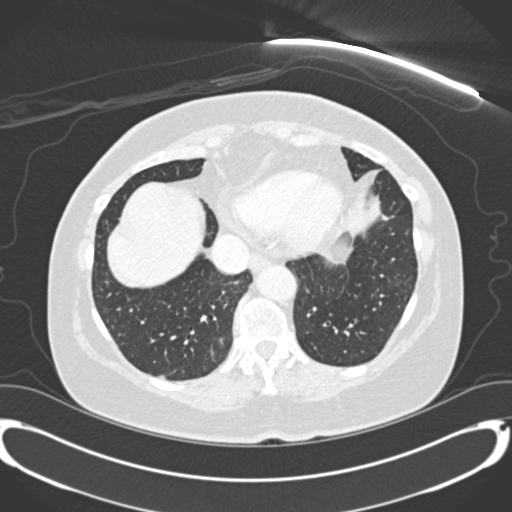
[im 159/397  mediastinal]
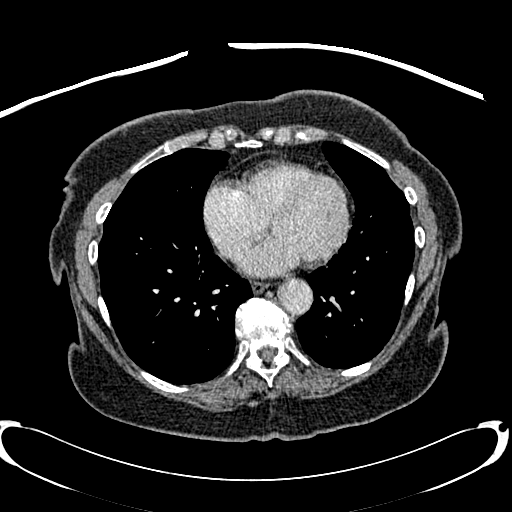
[im 159/397  lung]
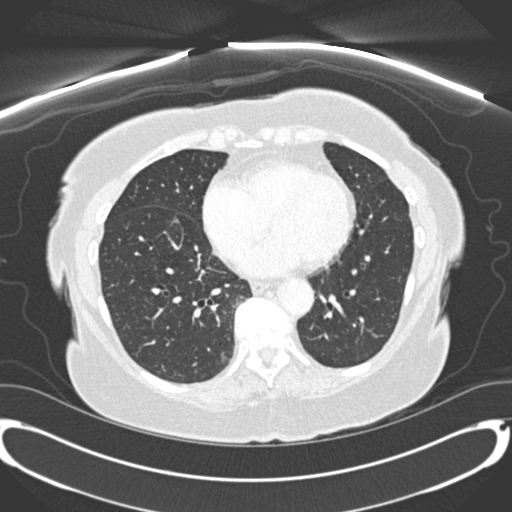
[im 179/397  lung]
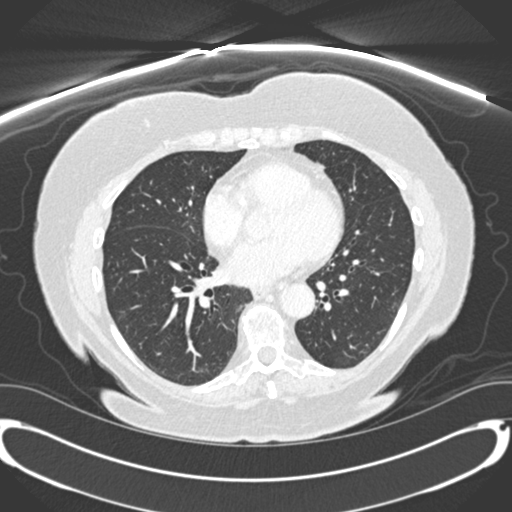
[im 218/397  lung]
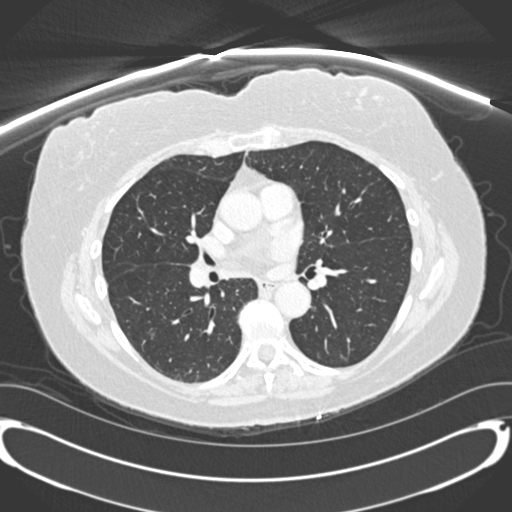
[im 238/397  lung]
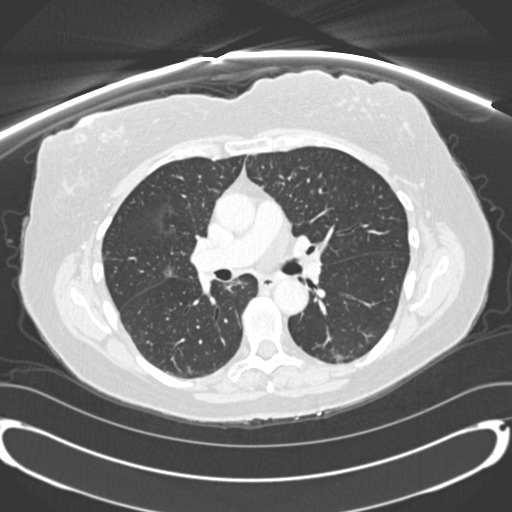
[im 278/397  mediastinal]
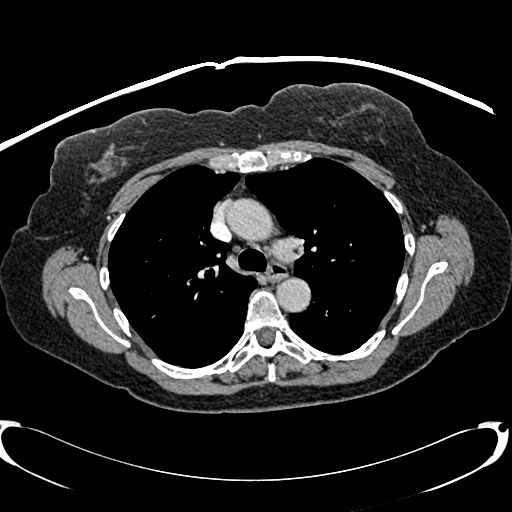
[im 278/397  lung]
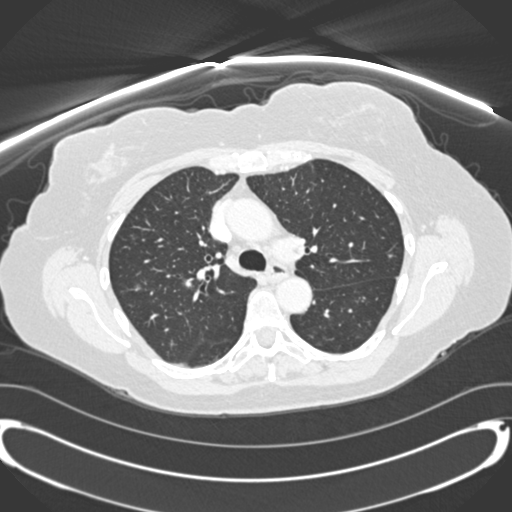
[im 317/397  lung]
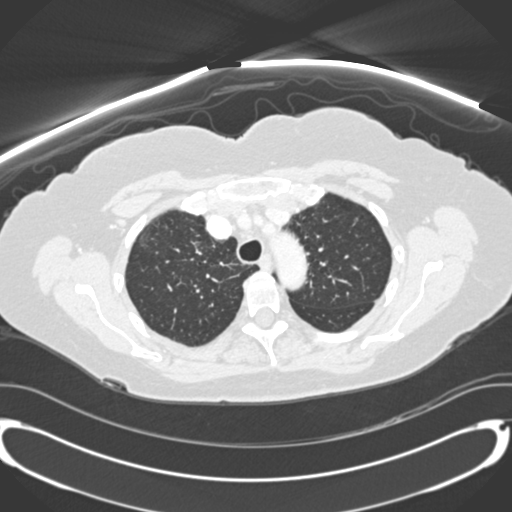
[im 337/397  lung]
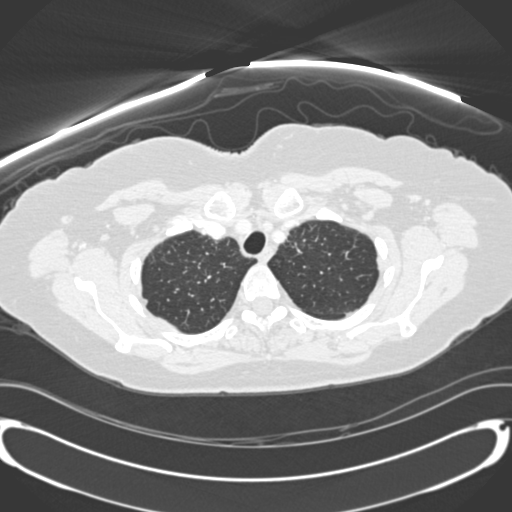
[im 377/397  lung]
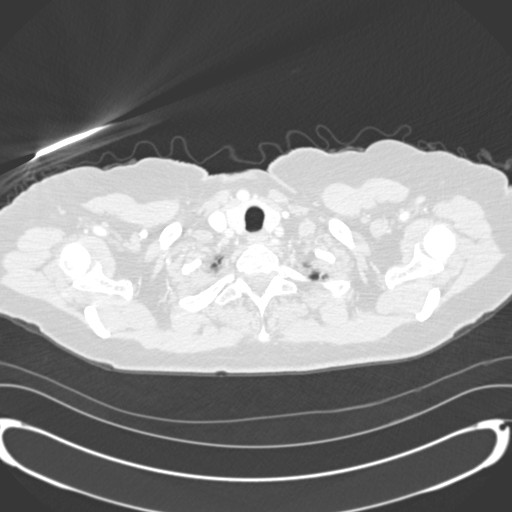

[Series 602: <mpr thick range> · coronal · 0.77mm/px · 3 of 101 slices shown]
[im 21/101  lung]
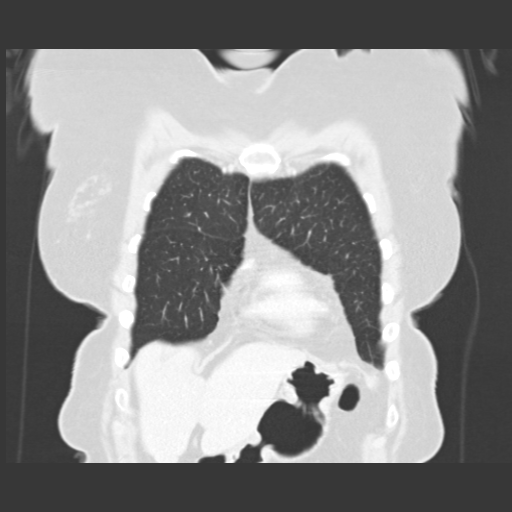
[im 41/101  lung]
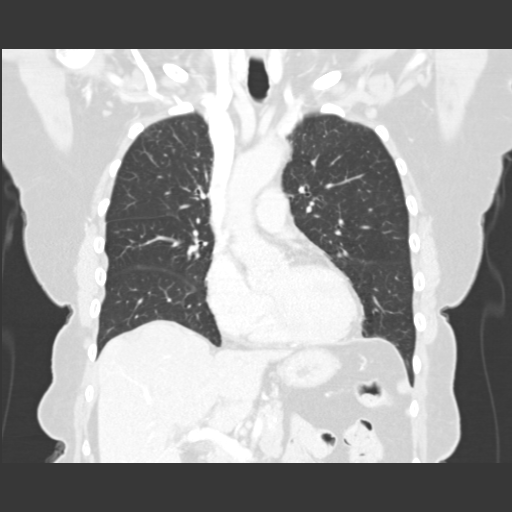
[im 61/101  lung]
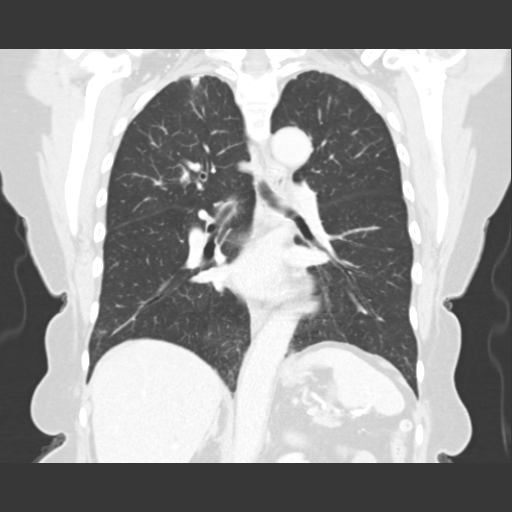

[15 of 36 positions shown; findings below may reference images not displayed]

FINDINGS: Within the left upper lobe there is a small nodule with
associated small cavitation which measures 12 mm x 9 mm not
significant changed from 10 mm x 10 mm on prior.  There is a 6 mm
nodule in the right upper lobe apex which is unchanged.  Ground-
glass type nodule in the right upper lobe measures 8 mm is
unchanged.  No new nodules are present.

No evidence of axillary or supraclavicular lymphadenopathy.  No
mediastinal or hilar adenopathy.  Right lower paratracheal lymph
node measures 8 mm and is within normal limits.  No pericardial
effusion.

Limited view of the upper abdomen shows normal adrenal glands.
Prior cholecystectomy.

Limited view of the skeleton is unremarkable.
IMPRESSION: 1.  No significant change in left upper lobe nodule with
associated cavitation.  Recommend follow-up CT scan in 3 to  6
months.
2.  Stable nodular thickening at the right lung apex.
3.  Stable ground-glass nodule in the right upper lobe.

## 2011-05-28 ENCOUNTER — Other Ambulatory Visit: Payer: Self-pay | Admitting: Family Medicine

## 2011-05-28 DIAGNOSIS — E049 Nontoxic goiter, unspecified: Secondary | ICD-10-CM

## 2011-06-04 ENCOUNTER — Ambulatory Visit
Admission: RE | Admit: 2011-06-04 | Discharge: 2011-06-04 | Disposition: A | Discharge status: Home / Self Care | Payer: Medicare Other | Source: Ambulatory Visit | Attending: Family Medicine | Admitting: Family Medicine

## 2011-06-04 DIAGNOSIS — E049 Nontoxic goiter, unspecified: Secondary | ICD-10-CM

## 2011-09-13 DIAGNOSIS — H3530 Unspecified macular degeneration: Secondary | ICD-10-CM | POA: Insufficient documentation

## 2011-09-13 DIAGNOSIS — IMO0002 Reserved for concepts with insufficient information to code with codable children: Secondary | ICD-10-CM | POA: Insufficient documentation

## 2012-06-06 ENCOUNTER — Other Ambulatory Visit: Payer: Self-pay | Admitting: Family Medicine

## 2012-06-06 DIAGNOSIS — E049 Nontoxic goiter, unspecified: Secondary | ICD-10-CM

## 2012-07-10 ENCOUNTER — Other Ambulatory Visit: Payer: Medicare Other

## 2014-02-06 DIAGNOSIS — Z961 Presence of intraocular lens: Secondary | ICD-10-CM | POA: Insufficient documentation

## 2016-02-26 ENCOUNTER — Other Ambulatory Visit: Payer: Self-pay | Admitting: Family Medicine

## 2016-02-26 DIAGNOSIS — R918 Other nonspecific abnormal finding of lung field: Secondary | ICD-10-CM

## 2016-03-05 ENCOUNTER — Ambulatory Visit
Admission: RE | Admit: 2016-03-05 | Discharge: 2016-03-05 | Disposition: A | Discharge status: Home / Self Care | Payer: Medicare Other | Source: Ambulatory Visit | Attending: Family Medicine | Admitting: Family Medicine

## 2016-03-05 DIAGNOSIS — R918 Other nonspecific abnormal finding of lung field: Secondary | ICD-10-CM

## 2016-03-05 MED ORDER — IOPAMIDOL (ISOVUE-300) INJECTION 61%: 75.0000 mL | Freq: Once | INTRAVENOUS | Status: DC | PRN

## 2016-04-22 ENCOUNTER — Other Ambulatory Visit: Payer: Self-pay | Admitting: Family Medicine

## 2016-04-22 DIAGNOSIS — R911 Solitary pulmonary nodule: Secondary | ICD-10-CM

## 2016-04-26 ENCOUNTER — Ambulatory Visit
Admission: RE | Admit: 2016-04-26 | Discharge: 2016-04-26 | Disposition: A | Discharge status: Home / Self Care | Payer: Medicare Other | Source: Ambulatory Visit | Attending: Family Medicine | Admitting: Family Medicine

## 2016-04-26 ENCOUNTER — Other Ambulatory Visit: Payer: Medicare Other

## 2016-04-26 DIAGNOSIS — R911 Solitary pulmonary nodule: Secondary | ICD-10-CM

## 2016-08-23 ENCOUNTER — Encounter (INDEPENDENT_AMBULATORY_CARE_PROVIDER_SITE_OTHER): Payer: Self-pay | Admitting: Orthopedic Surgery

## 2016-08-23 ENCOUNTER — Ambulatory Visit (INDEPENDENT_AMBULATORY_CARE_PROVIDER_SITE_OTHER): Payer: Medicare Other

## 2016-08-23 ENCOUNTER — Ambulatory Visit (INDEPENDENT_AMBULATORY_CARE_PROVIDER_SITE_OTHER): Payer: Medicare Other | Admitting: Family

## 2016-08-23 DIAGNOSIS — M7051 Other bursitis of knee, right knee: Secondary | ICD-10-CM | POA: Diagnosis not present

## 2016-08-23 DIAGNOSIS — M25561 Pain in right knee: Secondary | ICD-10-CM | POA: Diagnosis not present

## 2016-08-23 DIAGNOSIS — M25571 Pain in right ankle and joints of right foot: Secondary | ICD-10-CM | POA: Insufficient documentation

## 2016-08-25 NOTE — Progress Notes (Signed)
Office Visit Note   Patient: Kristen Hart           Date of Birth: 05/07/1927           MRN: [id number] Visit Date: 08/23/2016              Requested by: Catha Gosselin, MD [address] [place], [place] [address] PCP: Catha Gosselin, MD  Chief Complaint  Patient presents with  . Right Ankle - Pain, Injury  . Right Knee - Pain, Injury, Edema      HPI: The patient is an 83 year old woman seen today for initial evaluation of Right ankle and right knee pain. She fell on April 12 wall leaving all day. States she slipped on some fruit that she had stepped on at the checkout. She hurt her ankle at the time she states she landed on the lateral aspect of her body the ankle has continued to swell however is no longer painful. She does complain of continued right knee pain with proximal swelling states she can feel fluid beneath her skin. Associated warmth. States her knee Is also painful. Has tried taking glucosamine but it upset her stomach. Is using ice massage every morning as well as an ankle brace. Denies knee pain with ambulation, pain with palpation. State the pain is not in her knee joint.   Assessment & Plan: Visit Diagnoses:  1. Acute pain of right knee   2. Pain in right ankle and joints of right foot   3. Suprapatellar bursitis of right knee     Plan: Aspiration right knee. Provided a 6 inch ace bandage to wear daily for compression. May use Ibu or Aleve as needed for pain. Reassurance provided for ankle pain. Will follow up in office as needed.   Follow-Up Instructions: No Follow-up on file.   Right Knee Exam   Tenderness  The patient is experiencing tenderness in the medial retinaculum.  Range of Motion  The patient has normal right knee ROM.  Muscle Strength   The patient has normal right knee strength.  Tests  Varus: negative Valgus: negative  Other  Erythema: absent Swelling: moderate Other tests: no effusion present  Comments:  Swelling and  warmth lateral to patellar tendon. No erythema. No drainage or wound.       Patient is alert, oriented, no adenopathy, well-dressed, normal affect, normal respiratory effort.   Imaging: No results found.  Labs: No results found for: HGBA1C, ESRSEDRATE, CRP, LABURIC, REPTSTATUS, GRAMSTAIN, CULT, LABORGA  Orders:  Orders Placed This Encounter  Procedures  . XR KNEE 3 VIEW RIGHT  . XR Ankle Complete Right   No orders of the defined types were placed in this encounter.    Procedures: No procedures performed  Clinical Data: No additional findings.  ROS:  All other systems negative, except as noted in the HPI. Review of Systems  Constitutional: Negative for chills and fever.  Musculoskeletal: Positive for joint swelling. Negative for arthralgias.  Skin: Positive for wound. Negative for color change.    Objective: Vital Signs: There were no vitals taken for this visit.  Specialty Comments:  No specialty comments available.  PMFS History: Patient Active Problem List   Diagnosis Date Noted  . Acute pain of right knee 08/23/2016  . Pain in right ankle and joints of right foot 08/23/2016  . PULMONARY NODULE 05/20/2010  . THYROID NODULE 10/31/2009  . VITAMIN D DEFICIENCY 10/30/2009  . ALLERGIC RHINITIS CAUSE UNSPECIFIED 10/30/2009  . ASTHMA  10/30/2009  . ESOPHAGEAL SPASM 10/30/2009  . GERD 10/30/2009  . HIATAL HERNIA 10/30/2009  . IBS 10/30/2009  . FUNCTIONAL DIARRHEA 10/30/2009  . LOW BACK PAIN SYNDROME 10/30/2009  . SCIATICA 10/30/2009  . INSOMNIA 10/30/2009   Past Medical History:  Diagnosis Date  . Allergic rhinitis   . Asthma   . GERD (gastroesophageal reflux disease)   . Hiatal hernia   . IBS (irritable bowel syndrome)   . Insomnia   . Lumbar spondylosis   . Multinodular thyroid     Family History  Problem Relation Age of Onset  . Emphysema Brother   . Emphysema Father     Past Surgical History:  Procedure Laterality Date  . APPENDECTOMY      . BILATERAL SALPINGOOPHORECTOMY     fibroids  . CHOLECYSTECTOMY    . FOOT SURGERY     right  . SHOULDER SURGERY    . TONSILLECTOMY AND ADENOIDECTOMY    . VESICOVAGINAL FISTULA CLOSURE W/ TAH     Social History   Occupational History  . retired Runner, broadcasting/film/videoteacher    Social History Main Topics  . Smoking status: Former Smoker    Packs/day: 1.00    Years: 8.00    Types: Cigarettes    Quit date: 03/23/1959  . Smokeless tobacco: Never Used  . Alcohol use Yes  . Drug use: Unknown  . Sexual activity: Not on file

## 2017-11-15 ENCOUNTER — Ambulatory Visit: Payer: Medicare Other | Admitting: Podiatry

## 2017-11-15 ENCOUNTER — Encounter: Payer: Self-pay | Admitting: Podiatry

## 2017-11-15 ENCOUNTER — Ambulatory Visit (INDEPENDENT_AMBULATORY_CARE_PROVIDER_SITE_OTHER): Payer: Medicare Other

## 2017-11-15 VITALS — BP 132/61 | HR 58 | Resp 16

## 2017-11-15 DIAGNOSIS — M778 Other enthesopathies, not elsewhere classified: Secondary | ICD-10-CM

## 2017-11-15 DIAGNOSIS — Q828 Other specified congenital malformations of skin: Secondary | ICD-10-CM | POA: Diagnosis not present

## 2017-11-15 DIAGNOSIS — M7751 Other enthesopathy of right foot: Secondary | ICD-10-CM

## 2017-11-15 DIAGNOSIS — M779 Enthesopathy, unspecified: Principal | ICD-10-CM

## 2017-11-15 NOTE — Progress Notes (Signed)
Subjective:  Patient ID: Kristen Hart, female    DOB: 04-13-1927,  MRN: [id number] HPI Chief Complaint  Patient presents with  . Foot Pain    Plantar forefoot (Sub 1st) right - tender, callused area x months, painful to walk, PCP trimmed, using padding to offload, active in swimming for exercise 3 days a week and makes this activity uncomfortable  . New Patient (Initial Visit)    84 y.o. female presents with the above complaint.   ROS: Denies fever chills nausea vomiting muscle aches pains calf pain back pain chest pain shortness of breath.  Past Medical History:  Diagnosis Date  . Allergic rhinitis   . Asthma   . GERD (gastroesophageal reflux disease)   . Hiatal hernia   . IBS (irritable bowel syndrome)   . Insomnia   . Lumbar spondylosis   . Multinodular thyroid    Past Surgical History:  Procedure Laterality Date  . APPENDECTOMY    . BILATERAL SALPINGOOPHORECTOMY     fibroids  . CHOLECYSTECTOMY    . FOOT SURGERY     right  . SHOULDER SURGERY    . TONSILLECTOMY AND ADENOIDECTOMY    . VESICOVAGINAL FISTULA CLOSURE W/ TAH      Current Outpatient Medications:  .  Calcium 1500 MG tablet, Take 1,500 mg by mouth daily.  , Disp: , Rfl:  .  cholecalciferol (VITAMIN D) 1000 UNITS tablet, Take 2,000 Units by mouth daily.  , Disp: , Rfl:  .  Multiple Vitamin (MULTIVITAMIN) tablet, Take 1 tablet by mouth daily.  , Disp: , Rfl:  .  Multiple Vitamins-Minerals (PRESERVISION AREDS 2 PO), Take by mouth., Disp: , Rfl:   Allergies  Allergen Reactions  . Aspirin   . Codeine   . Oxycodone Nausea Only  . Penicillins    Review of Systems Objective:   Vitals:   11/15/17 0948  BP: 132/61  Pulse: (!) 58  Resp: 16    General: Well developed, nourished, in no acute distress, alert and oriented x3   Dermatological: Skin is warm, dry and supple bilateral. Nails x 10 are well maintained; remaining integument appears unremarkable at this time. There are no open sores, no  preulcerative lesions, no rash or signs of infection present.  Reactive hyper keratoma porokeratotic lesion sub-first metatarsal phalangeal joint beneath the tibial sesamoid.  There appears to be a small area of fluctuance beneath the tibial sesamoid.  Possibly a bursitis.  Vascular: Dorsalis Pedis artery and Posterior Tibial artery pedal pulses are 2/4 bilateral with immedate capillary fill time. Pedal hair growth present. No varicosities and no lower extremity edema present bilateral.   Neruologic: Grossly intact via light touch bilateral. Vibratory intact via tuning fork bilateral. Protective threshold with Semmes Wienstein monofilament intact to all pedal sites bilateral. Patellar and Achilles deep tendon reflexes 2+ bilateral. No Babinski or clonus noted bilateral.   Musculoskeletal: No gross boney pedal deformities bilateral. No pain, crepitus, or limitation noted with foot and ankle range of motion bilateral. Muscular strength 5/5 in all groups tested bilateral.  Gait: Unassisted, Nonantalgic.    Radiographs:  Radiographs taken today demonstrate pes planus right.  Osteoarthritic changes of the midfoot.  Mild bunion deformity.  Assessment & Plan:   Assessment: Bursitis capsulitis possible sesamoiditis right first metatarsal phalangeal joint.  Porokeratosis plantar aspect right foot.    Plan: Discussed etiology pathology conservative or surgical therapies.  At this point after sterile Betadine skin prep I injected 2 mg dexamethasone and local anesthetic to the  point of maximal tenderness sub-first metatarsal phalangeal joint.  I debrided reactive hyperkeratosis deeply.  There is no iatrogenic lesions noted and a Band-Aid was placed.  I will follow-up with her on an as-needed basis.     Reilly Blades T. Millbrook ColonyHyatt, North DakotaDPM

## 2018-05-12 ENCOUNTER — Ambulatory Visit (INDEPENDENT_AMBULATORY_CARE_PROVIDER_SITE_OTHER): Payer: Medicare Other | Admitting: Orthopedic Surgery

## 2018-05-12 ENCOUNTER — Encounter (INDEPENDENT_AMBULATORY_CARE_PROVIDER_SITE_OTHER): Payer: Self-pay | Admitting: Orthopedic Surgery

## 2018-05-12 ENCOUNTER — Ambulatory Visit (INDEPENDENT_AMBULATORY_CARE_PROVIDER_SITE_OTHER): Payer: Self-pay

## 2018-05-12 DIAGNOSIS — M25512 Pain in left shoulder: Secondary | ICD-10-CM

## 2018-05-12 DIAGNOSIS — M7542 Impingement syndrome of left shoulder: Secondary | ICD-10-CM

## 2018-05-12 MED ORDER — BUPIVACAINE HCL 0.5 % IJ SOLN
9.0000 mL | INTRAMUSCULAR | Status: AC | PRN
  Administered 2018-05-12: 9 mL via INTRA_ARTICULAR

## 2018-05-12 MED ORDER — METHYLPREDNISOLONE ACETATE 40 MG/ML IJ SUSP
40.0000 mg | INTRAMUSCULAR | Status: AC | PRN
  Administered 2018-05-12: 40 mg via INTRA_ARTICULAR

## 2018-05-12 MED ORDER — LIDOCAINE HCL 1 % IJ SOLN
5.0000 mL | INTRAMUSCULAR | Status: AC | PRN
  Administered 2018-05-12: 5 mL

## 2018-05-12 NOTE — Progress Notes (Signed)
Office Visit Note   Patient: Kristen Hart           Date of Birth: Apr 02, 1927           MRN: [id number] Visit Date: 05/12/2018 Requested by: Catha Gosselin, MD [address] [place], [place] [address] PCP: Catha Gosselin, MD  Subjective: Chief Complaint  Patient presents with  . Left Shoulder - Pain    HPI: Kristen Hart is a patient with left shoulder pain.  She had shoulder arthroscopy about 12 years ago.  Reports 1 month of pain without history of injury.  It is getting little bit worse.  She reports stiffness and inability to put her arm behind her back.  She is left-hand dominant.  Takes Tylenol at night.  Reports some trapezial type pain but no numbness and tingling in that left arm.              ROS: All systems reviewed are negative as they relate to the chief complaint within the history of present illness.  Patient denies  fevers or chills.   Assessment & Plan: Visit Diagnoses:  1. Left shoulder pain, unspecified chronicity   2. Impingement syndrome of left shoulder     Plan: Impression is impingement syndrome possible early frozen shoulder and possible small rotator cuff tear left shoulder.  Patient wants to avoid any type of surgical intervention.  We will try an injection today into the subacromial space to see if that helps with symptom resolution.  Continue with range of motion exercises because if this is an early frozen shoulder I do not want to get stiffer.  We could consider repeat injection in 6 weeks and if that fails more imaging to determine if there is any type of problem that she would consider operative intervention for.  I will see her back in 6 weeks if she wants to get another subacromial injection.  Follow-Up Instructions: Return if symptoms worsen or fail to improve.   Orders:  Orders Placed This Encounter  Procedures  . XR Shoulder Left   No orders of the defined types were placed in this encounter.     Procedures: Large Joint Inj: L  subacromial bursa on 05/12/2018 12:05 PM Indications: diagnostic evaluation and pain Details: 18 G 1.5 in needle, posterior approach  Arthrogram: No  Medications: 9 mL bupivacaine 0.5 %; 40 mg methylPREDNISolone acetate 40 MG/ML; 5 mL lidocaine 1 % Outcome: tolerated well, no immediate complications Procedure, treatment alternatives, risks and benefits explained, specific risks discussed. Consent was given by the patient. Immediately prior to procedure a time out was called to verify the correct patient, procedure, equipment, support staff and site/side marked as required. Patient was prepped and draped in the usual sterile fashion.       Clinical Data: No additional findings.  Objective: Vital Signs: There were no vitals taken for this visit.  Physical Exam:   Constitutional: Patient appears well-developed HEENT:  Head: Normocephalic Eyes:EOM are normal Neck: Normal range of motion Cardiovascular: Normal rate Pulmonary/chest: Effort normal Neurologic: Patient is alert Skin: Skin is warm Psychiatric: Patient has normal mood and affect    Ortho Exam: Ortho exam demonstrates good cervical spine range of motion.  5 out of 5 grip EPL FPL interosseous wrist flexion extension bicep triceps and deltoid strength.  Right shoulder has smooth seamless range of motion.  Left shoulder does have some coarseness and grinding with internal and external rotation of 15 degrees of abduction.  No other masses lymphadenopathy or  skin changes noted in that shoulder girdle region.  She does have difficulty going behind her back on the left compared to the right but static external rotation at 15 degrees of abduction is about 60 degrees bilaterally.  Infraspinatus sub-supraspinatus and subscap strength is slightly weaker on the left compared to the right but that may be more pain related.  Specialty Comments:  No specialty comments available.  Imaging: Xr Shoulder Left  Result Date: 05/12/2018 AP  axillary outlet left shoulder reviewed.  Acromiohumeral distance mildly narrowed.  No glenohumeral or AC joint arthritis.  No fracture dislocation present.  Mild osteopenia.  Visualized lung fields clear.    PMFS History: Patient Active Problem List   Diagnosis Date Noted  . Acute pain of right knee 08/23/2016  . Pain in right ankle and joints of right foot 08/23/2016  . Pseudophakia of both eyes 02/06/2014  . AMD (age related macular degeneration) 09/13/2011  . Nuclear cataract 09/13/2011  . PULMONARY NODULE 05/20/2010  . THYROID NODULE 10/31/2009  . VITAMIN D DEFICIENCY 10/30/2009  . ALLERGIC RHINITIS CAUSE UNSPECIFIED 10/30/2009  . ASTHMA 10/30/2009  . ESOPHAGEAL SPASM 10/30/2009  . GERD 10/30/2009  . HIATAL HERNIA 10/30/2009  . IBS 10/30/2009  . FUNCTIONAL DIARRHEA 10/30/2009  . LOW BACK PAIN SYNDROME 10/30/2009  . SCIATICA 10/30/2009  . INSOMNIA 10/30/2009   Past Medical History:  Diagnosis Date  . Allergic rhinitis   . Asthma   . GERD (gastroesophageal reflux disease)   . Hiatal hernia   . IBS (irritable bowel syndrome)   . Insomnia   . Lumbar spondylosis   . Multinodular thyroid     Family History  Problem Relation Age of Onset  . Emphysema Brother   . Emphysema Father     Past Surgical History:  Procedure Laterality Date  . APPENDECTOMY    . BILATERAL SALPINGOOPHORECTOMY     fibroids  . CHOLECYSTECTOMY    . FOOT SURGERY     right  . SHOULDER SURGERY    . TONSILLECTOMY AND ADENOIDECTOMY    . VESICOVAGINAL FISTULA CLOSURE W/ TAH     Social History   Occupational History  . Occupation: retired Runner, broadcasting/film/video  Tobacco Use  . Smoking status: Former Smoker    Packs/day: 1.00    Years: 8.00    Pack years: 8.00    Types: Cigarettes    Last attempt to quit: 03/23/1959    Years since quitting: 59.1  . Smokeless tobacco: Never Used  Substance and Sexual Activity  . Alcohol use: Yes  . Drug use: Not on file  . Sexual activity: Not on file

## 2018-10-25 ENCOUNTER — Other Ambulatory Visit: Payer: Self-pay

## 2018-10-25 ENCOUNTER — Encounter: Payer: Self-pay | Admitting: Orthopedic Surgery

## 2018-10-25 ENCOUNTER — Ambulatory Visit (INDEPENDENT_AMBULATORY_CARE_PROVIDER_SITE_OTHER): Payer: Medicare Other | Admitting: Orthopedic Surgery

## 2018-10-25 DIAGNOSIS — M7542 Impingement syndrome of left shoulder: Secondary | ICD-10-CM | POA: Diagnosis not present

## 2018-10-25 MED ORDER — LIDOCAINE HCL 1 % IJ SOLN
5.0000 mL | INTRAMUSCULAR | Status: AC | PRN
  Administered 2018-10-25: 5 mL

## 2018-10-25 MED ORDER — BUPIVACAINE HCL 0.5 % IJ SOLN
9.0000 mL | INTRAMUSCULAR | Status: AC | PRN
  Administered 2018-10-25: 9 mL via INTRA_ARTICULAR

## 2018-10-25 MED ORDER — METHYLPREDNISOLONE ACETATE 40 MG/ML IJ SUSP
40.0000 mg | INTRAMUSCULAR | Status: AC | PRN
  Administered 2018-10-25: 40 mg via INTRA_ARTICULAR

## 2018-10-25 NOTE — Progress Notes (Signed)
Office Visit Note   Patient: Kristen Hart           Date of Birth: Sep 14, 1927           MRN: [id number] Visit Date: 10/25/2018 Requested by: Catha GosselinLittle, Kevin, MD [address] [place],  [place] [id number] PCP: Catha GosselinLittle, Kevin, MD  Subjective: Chief Complaint  Patient presents with  . Left Shoulder - Pain    HPI: Kristen Hart is an 85 year old patient with left shoulder pain.  She had left shoulder impingement diagnosed in February and had an injection which did well until about 2 months ago.  She is been helping her husband who has been sick.  She would like to have a repeat injection today.  No interval history of injury and no real mechanical symptoms or weakness or radicular symptoms in that left arm.              ROS: All systems reviewed are negative as they relate to the chief complaint within the history of present illness.  Patient denies  fevers or chills.   Assessment & Plan: Visit Diagnoses:  1. Impingement syndrome of left shoulder     Plan: Impression is left shoulder pain with good relief from subacromial injection 05/12/2018.  Repeat injection performed today.  She could potentially get those several times a year if needed.  Rotator cuff strength is good today no evidence of frozen shoulder.  Follow-Up Instructions: Return if symptoms worsen or fail to improve.   Orders:  No orders of the defined types were placed in this encounter.  No orders of the defined types were placed in this encounter.     Procedures: Large Joint Inj: L subacromial bursa on 10/25/2018 3:23 PM Indications: diagnostic evaluation and pain Details: 18 G 1.5 in needle, posterior approach  Arthrogram: No  Medications: 9 mL bupivacaine 0.5 %; 40 mg methylPREDNISolone acetate 40 MG/ML; 5 mL lidocaine 1 % Outcome: tolerated well, no immediate complications Procedure, treatment alternatives, risks and benefits explained, specific risks discussed. Consent was given by the patient.  Immediately prior to procedure a time out was called to verify the correct patient, procedure, equipment, support staff and site/side marked as required. Patient was prepped and draped in the usual sterile fashion.       Clinical Data: No additional findings.  Objective: Vital Signs: There were no vitals taken for this visit.  Physical Exam:   Constitutional: Patient appears well-developed HEENT:  Head: Normocephalic Eyes:EOM are normal Neck: Normal range of motion Cardiovascular: Normal rate Pulmonary/chest: Effort normal Neurologic: Patient is alert Skin: Skin is warm Psychiatric: Patient has normal mood and affect    Ortho Exam: Ortho exam demonstrates good active and passive range of motion of the left shoulder with no rotator cuff weakness.  No masses lymphadenopathy or skin changes noted in that shoulder girdle region.  Rotator cuff strength is intact.  Not much in way of coarse grinding or crepitus with left shoulder  motion..  Impingement signs positive. Specialty Comments:  No specialty comments available.  Imaging: No results found.   PMFS History: Patient Active Problem List   Diagnosis Date Noted  . Acute pain of right knee 08/23/2016  . Pain in right ankle and joints of right foot 08/23/2016  . Pseudophakia of both eyes 02/06/2014  . AMD (age related macular degeneration) 09/13/2011  . Nuclear cataract 09/13/2011  . PULMONARY NODULE 05/20/2010  . THYROID NODULE 10/31/2009  . VITAMIN D DEFICIENCY 10/30/2009  . ALLERGIC RHINITIS CAUSE  UNSPECIFIED 10/30/2009  . ASTHMA 10/30/2009  . ESOPHAGEAL SPASM 10/30/2009  . GERD 10/30/2009  . HIATAL HERNIA 10/30/2009  . IBS 10/30/2009  . FUNCTIONAL DIARRHEA 10/30/2009  . LOW BACK PAIN SYNDROME 10/30/2009  . SCIATICA 10/30/2009  . INSOMNIA 10/30/2009   Past Medical History:  Diagnosis Date  . Allergic rhinitis   . Asthma   . GERD (gastroesophageal reflux disease)   . Hiatal hernia   . IBS (irritable bowel  syndrome)   . Insomnia   . Lumbar spondylosis   . Multinodular thyroid     Family History  Problem Relation Age of Onset  . Emphysema Brother   . Emphysema Father     Past Surgical History:  Procedure Laterality Date  . APPENDECTOMY    . BILATERAL SALPINGOOPHORECTOMY     fibroids  . CHOLECYSTECTOMY    . FOOT SURGERY     right  . SHOULDER SURGERY    . TONSILLECTOMY AND ADENOIDECTOMY    . VESICOVAGINAL FISTULA CLOSURE W/ TAH     Social History   Occupational History  . Occupation: retired Pharmacist, hospital  Tobacco Use  . Smoking status: Former Smoker    Packs/day: 1.00    Years: 8.00    Pack years: 8.00    Types: Cigarettes    Quit date: 03/23/1959    Years since quitting: 59.6  . Smokeless tobacco: Never Used  Substance and Sexual Activity  . Alcohol use: Yes  . Drug use: Not on file  . Sexual activity: Not on file

## 2019-02-21 ENCOUNTER — Ambulatory Visit: Payer: Medicare Other | Admitting: Orthopedic Surgery

## 2019-02-21 ENCOUNTER — Other Ambulatory Visit: Payer: Self-pay

## 2019-02-21 ENCOUNTER — Encounter: Payer: Self-pay | Admitting: Orthopedic Surgery

## 2019-02-21 DIAGNOSIS — M7542 Impingement syndrome of left shoulder: Secondary | ICD-10-CM | POA: Diagnosis not present

## 2019-02-22 ENCOUNTER — Encounter: Payer: Self-pay | Admitting: Orthopedic Surgery

## 2019-02-22 DIAGNOSIS — M7542 Impingement syndrome of left shoulder: Secondary | ICD-10-CM

## 2019-02-22 MED ORDER — BUPIVACAINE HCL 0.5 % IJ SOLN
9.0000 mL | INTRAMUSCULAR | Status: AC | PRN
  Administered 2019-02-22: 9 mL via INTRA_ARTICULAR

## 2019-02-22 MED ORDER — LIDOCAINE HCL 1 % IJ SOLN
5.0000 mL | INTRAMUSCULAR | Status: AC | PRN
  Administered 2019-02-22: 08:00:00 5 mL

## 2019-02-22 MED ORDER — METHYLPREDNISOLONE ACETATE 40 MG/ML IJ SUSP
40.0000 mg | INTRAMUSCULAR | Status: AC | PRN
  Administered 2019-02-22: 08:00:00 40 mg via INTRA_ARTICULAR

## 2019-02-22 NOTE — Progress Notes (Signed)
Office Visit Note   Patient: Kristen Hart           Date of Birth: [birth year]           MRN: [id number] Visit Date: 02/21/2019 Requested by: Hulan Fess, MD [place],  [place] [address] PCP: Hulan Fess, MD  Subjective: Chief Complaint  Patient presents with  . Left Shoulder - Follow-up    HPI: Kristen Hart is a patient with left shoulder pain.  Known history of impingement bursitis.  Last injection 10/25/2018 which gave her some relief but not as much as the first injection.  She denies any interval history of injury.  Does wake her from sleep at night.  She does take Tylenol at night.  She is living at pending burn.              ROS: All systems reviewed are negative as they relate to the chief complaint within the history of present illness.  Patient denies  fevers or chills.   Assessment & Plan: Visit Diagnoses:  1. Impingement syndrome of left shoulder     Plan: Impression is left shoulder pain with maintained passive range of motion and positive response to subacromial injections in the past.  Plan is repeat subacromial injection today.  Is done in the subacromial space.  No evidence of rotator cuff tear or weakness on exam.  No evidence of frozen shoulder today.  Follow-up as needed.  Follow-Up Instructions: No follow-ups on file.   Orders:  No orders of the defined types were placed in this encounter.  No orders of the defined types were placed in this encounter.     Procedures: Large Joint Inj: L subacromial bursa on 02/22/2019 8:05 AM Indications: diagnostic evaluation and pain Details: 18 G 1.5 in needle, posterior approach  Arthrogram: No  Medications: 9 mL bupivacaine 0.5 %; 40 mg methylPREDNISolone acetate 40 MG/ML; 5 mL lidocaine 1 % Outcome: tolerated well, no immediate complications Procedure, treatment alternatives, risks and benefits explained, specific risks discussed. Consent was given by the patient. Immediately prior to  procedure a time out was called to verify the correct patient, procedure, equipment, support staff and site/side marked as required. Patient was prepped and draped in the usual sterile fashion.       Clinical Data: No additional findings.  Objective: Vital Signs: There were no vitals taken for this visit.  Physical Exam:   Constitutional: Patient appears well-developed HEENT:  Head: Normocephalic Eyes:EOM are normal Neck: Normal range of motion Cardiovascular: Normal rate Pulmonary/chest: Effort normal Neurologic: Patient is alert Skin: Skin is warm Psychiatric: Patient has normal mood and affect    Ortho Exam: Ortho exam demonstrates full active and passive range of motion of the cervical spine.  Left shoulder has symmetric external rotation of 15 degrees of abduction.  No masses lymphadenopathy or skin changes noted in the shoulder girdle region.  Radial pulses intact.  She has positive impingement signs.  Good rotator cuff strength isolated infraspinatus supraspinatus and subscap muscle testing.  Specialty Comments:  No specialty comments available.  Imaging: No results found.   PMFS History: Patient Active Problem List   Diagnosis Date Noted  . Acute pain of right knee 08/23/2016  . Pain in right ankle and joints of right foot 08/23/2016  . Pseudophakia of both eyes 02/06/2014  . AMD (age related macular degeneration) 09/13/2011  . Nuclear cataract 09/13/2011  . PULMONARY NODULE 05/20/2010  . THYROID NODULE 10/31/2009  . VITAMIN D DEFICIENCY  10/30/2009  . ALLERGIC RHINITIS CAUSE UNSPECIFIED 10/30/2009  . ASTHMA 10/30/2009  . ESOPHAGEAL SPASM 10/30/2009  . GERD 10/30/2009  . HIATAL HERNIA 10/30/2009  . IBS 10/30/2009  . FUNCTIONAL DIARRHEA 10/30/2009  . LOW BACK PAIN SYNDROME 10/30/2009  . SCIATICA 10/30/2009  . INSOMNIA 10/30/2009   Past Medical History:  Diagnosis Date  . Allergic rhinitis   . Asthma   . GERD (gastroesophageal reflux disease)   .  Hiatal hernia   . IBS (irritable bowel syndrome)   . Insomnia   . Lumbar spondylosis   . Multinodular thyroid     Family History  Problem Relation Age of Onset  . Emphysema Brother   . Emphysema Father     Past Surgical History:  Procedure Laterality Date  . APPENDECTOMY    . BILATERAL SALPINGOOPHORECTOMY     fibroids  . CHOLECYSTECTOMY    . FOOT SURGERY     right  . SHOULDER SURGERY    . TONSILLECTOMY AND ADENOIDECTOMY    . VESICOVAGINAL FISTULA CLOSURE W/ TAH     Social History   Occupational History  . Occupation: retired Runner, broadcasting/film/video  Tobacco Use  . Smoking status: Former Smoker    Packs/day: 1.00    Years: 8.00    Pack years: 8.00    Types: Cigarettes    Quit date: 03/23/1959    Years since quitting: 59.9  . Smokeless tobacco: Never Used  Substance and Sexual Activity  . Alcohol use: Yes  . Drug use: Not on file  . Sexual activity: Not on file

## 2019-04-12 ENCOUNTER — Other Ambulatory Visit: Payer: Self-pay

## 2019-04-12 ENCOUNTER — Ambulatory Visit: Payer: Medicare Other | Admitting: Podiatry

## 2019-04-12 ENCOUNTER — Encounter: Payer: Self-pay | Admitting: Podiatry

## 2019-04-12 DIAGNOSIS — Q828 Other specified congenital malformations of skin: Secondary | ICD-10-CM

## 2019-04-12 NOTE — Progress Notes (Signed)
She presents today chief complaint of painful calluses subfirst metatarsophalangeal joints bilaterally.  Objective: Vital signs are stable she is alert oriented x3 solitary porokeratotic lesion subfirst metatarsal phalangeal joint bilaterally overlying the tibial sesamoid secondary to fat pad atrophy from age most likely.  Assessment: Painful porokeratosis subfirst metatarsal bilateral.  Plan: I went ahead and debrided the reactive hyperkeratotic lesions placed padding today follow-up with her as needed.

## 2019-07-10 ENCOUNTER — Other Ambulatory Visit: Payer: Self-pay

## 2019-07-10 ENCOUNTER — Encounter: Payer: Self-pay | Admitting: Podiatry

## 2019-07-10 ENCOUNTER — Ambulatory Visit: Payer: Medicare Other | Admitting: Podiatry

## 2019-07-10 DIAGNOSIS — Q828 Other specified congenital malformations of skin: Secondary | ICD-10-CM

## 2019-07-10 NOTE — Progress Notes (Signed)
She presents today for chief complaint of a painful lesion subfirst metatarsophalangeal joint bilaterally right greater than left.  States that the right foot is really gotten sore and I was here just not too long ago.  Objective: Vital signs are stable alert oriented x3.  There is no erythema edema cellulitis drainage or odor solitary porokeratotic lesion subfirst metatarsophalangeal joint right greater than left.  Assessment: Painful porokeratosis subfirst metatarsophalangeal joint bilateral right greater than left.  Plan: Sharp debridement of the lesion today with chemical destruction of the lesion with salicylic acid under occlusion to be left on 30 days and then washed off thoroughly follow-up with her in 6 weeks if necessary.

## 2019-11-21 ENCOUNTER — Ambulatory Visit: Payer: Medicare Other | Admitting: Orthopedic Surgery

## 2019-11-21 DIAGNOSIS — M7542 Impingement syndrome of left shoulder: Secondary | ICD-10-CM

## 2019-11-22 ENCOUNTER — Ambulatory Visit: Payer: Medicare Other | Admitting: Podiatry

## 2019-11-22 ENCOUNTER — Other Ambulatory Visit: Payer: Self-pay

## 2019-11-22 ENCOUNTER — Encounter: Payer: Self-pay | Admitting: Podiatry

## 2019-11-22 DIAGNOSIS — Q828 Other specified congenital malformations of skin: Secondary | ICD-10-CM | POA: Diagnosis not present

## 2019-11-22 NOTE — Progress Notes (Signed)
She presents today chief complaint of a painful lesion subfirst metatarsophalangeal joint of the right foot.  Objective: Vital signs are stable alert oriented x3.  Pulses are palpable.  There is no erythema edema cellulitis drainage or odor.  Porokeratotic lesion subfirst metatarsophalangeal joint does not demonstrate any superficial skin breakdown.  No signs of infection.  Assessment: Painful poor keratoma plantar aspect of the first metatarsophalangeal joint of the right foot.  Plan: Mechanical debridement followed by chemical debridement under occlusion to be left on for 3 days without getting wet then washed off thoroughly.  I will follow-up with her in about 3 months for reevaluation.

## 2019-11-25 ENCOUNTER — Encounter: Payer: Self-pay | Admitting: Orthopedic Surgery

## 2019-11-25 NOTE — Progress Notes (Signed)
Office Visit Note   Patient: Kristen Hart           Date of Birth: [birth year]           MRN: [id number] Visit Date: 11/21/2019 Requested by: Juluis Rainier, MD [address] [place],  [place] [address] PCP: Juluis Rainier, MD  Subjective: Chief Complaint  Patient presents with   Left Shoulder - Pain    HPI: Kristen Hart is a 86 y.o. female who presents to the office complaining of left shoulder pain.  He has a history of left shoulder impingement for which she has received serial subacromial injections.  She presents complaining of increased pain in her left shoulder.  Pain is waking her up at night.  She notes similar pain to the prior left shoulder pain that she has had in the past.  She notes radiation to the left elbow.  She denies any recent injury.  She is able to lift the arm and denies any subjective weakness.  She has had multiple subacromial injection in the past with good relief (about 3 months).                ROS: All systems reviewed are negative as they relate to the chief complaint within the history of present illness.  Patient denies fevers or chills.  Assessment & Plan: Visit Diagnoses:  1. Impingement syndrome of left shoulder     Plan: Patient is a 86 year old female who presents complaining of left shoulder pain.  She has a history of left shoulder impingement.  She has had good relief with subacromial injection in the past.  She request another injection.  She has had no recent injury.  No weakness on exam.  Subacromial injection administered and patient tolerated the procedure well.  Plan for patient to follow-up as needed.  Follow-Up Instructions: No follow-ups on file.   Orders:  No orders of the defined types were placed in this encounter.  No orders of the defined types were placed in this encounter.     Procedures: No procedures performed   Clinical Data: No additional findings.  Objective: Vital Signs: There were no  vitals taken for this visit.  Physical Exam:  Constitutional: Patient appears well-developed HEENT:  Head: Normocephalic Eyes:EOM are normal Neck: Normal range of motion Cardiovascular: Normal rate Pulmonary/chest: Effort normal Neurologic: Patient is alert Skin: Skin is warm Psychiatric: Patient has normal mood and affect  Ortho Exam: Ortho exam demonstrates left shoulder with preserved range of motion is equivalent to the contralateral side.  Excellent strength of the supraspinatus, infraspinatus, subscapularis.  5/5 motor strength of the bilateral grip, finger abduction, pronation/supination, bicep, tricep.  Mild tenderness over the bicipital groove.  No tenderness over the [facility] joint.  Positive Neer's and Hawkins impingement signs.  Small amount of crepitus felt with passive range of motion the shoulder.  No tenderness over the axial cervical spine.  No pain with cervical spine range of motion.  Specialty Comments:  No specialty comments available.  Imaging: No results found.   PMFS History: Patient Active Problem List   Diagnosis Date Noted   Acute pain of right knee 08/23/2016   Pain in right ankle and joints of right foot 08/23/2016   Pseudophakia of both eyes 02/06/2014   AMD (age related macular degeneration) 09/13/2011   Nuclear cataract 09/13/2011   PULMONARY NODULE 05/20/2010   THYROID NODULE 10/31/2009   VITAMIN D DEFICIENCY 10/30/2009   ALLERGIC RHINITIS CAUSE UNSPECIFIED 10/30/2009  ASTHMA 10/30/2009   ESOPHAGEAL SPASM 10/30/2009   GERD 10/30/2009   HIATAL HERNIA 10/30/2009   IBS 10/30/2009   FUNCTIONAL DIARRHEA 10/30/2009   LOW BACK PAIN SYNDROME 10/30/2009   SCIATICA 10/30/2009   INSOMNIA 10/30/2009   Past Medical History:  Diagnosis Date   Allergic rhinitis    Asthma    GERD (gastroesophageal reflux disease)    Hiatal hernia    IBS (irritable bowel syndrome)    Insomnia    Lumbar spondylosis    Multinodular thyroid       Family History  Problem Relation Age of Onset   Emphysema Brother    Emphysema Father     Past Surgical History:  Procedure Laterality Date   APPENDECTOMY     BILATERAL SALPINGOOPHORECTOMY     fibroids   CHOLECYSTECTOMY     FOOT SURGERY     right   SHOULDER SURGERY     TONSILLECTOMY AND ADENOIDECTOMY     VESICOVAGINAL FISTULA CLOSURE W/ TAH     Social History   Occupational History   Occupation: retired Runner, broadcasting/film/video  Tobacco Use   Smoking status: Former Smoker    Packs/day: 1.00    Years: 8.00    Pack years: 8.00    Types: Cigarettes    Quit date: 03/23/1959    Years since quitting: 60.7   Smokeless tobacco: Never Used  Substance and Sexual Activity   Alcohol use: Yes   Drug use: Not on file   Sexual activity: Not on file

## 2019-11-26 ENCOUNTER — Encounter: Payer: Self-pay | Admitting: Orthopedic Surgery

## 2020-02-21 ENCOUNTER — Ambulatory Visit: Payer: Medicare Other | Admitting: Podiatry

## 2020-04-09 ENCOUNTER — Other Ambulatory Visit: Payer: Self-pay

## 2020-04-09 ENCOUNTER — Ambulatory Visit (INDEPENDENT_AMBULATORY_CARE_PROVIDER_SITE_OTHER): Payer: Medicare Other | Admitting: Orthopedic Surgery

## 2020-04-09 DIAGNOSIS — M7542 Impingement syndrome of left shoulder: Secondary | ICD-10-CM

## 2020-04-13 ENCOUNTER — Encounter: Payer: Self-pay | Admitting: Orthopedic Surgery

## 2020-04-13 NOTE — Progress Notes (Signed)
Office Visit Note   Patient: Kristen Hart           Date of Birth: 19-Aug-1927           MRN: [id number] Visit Date: 04/09/2020 Requested by: Juluis Rainier, MD [address] [place],  [place] [address] PCP: Juluis Rainier, MD  Subjective: Chief Complaint  Patient presents with  . Left Shoulder - Pain    HPI: Kristen Hart is a 87 y.o. female who presents to the office complaining of left shoulder pain.  She returns for evaluation of left shoulder pain.  She requests injection.  Last injection was in September 2021 which provided excellent relief.  She notes decreased range of motion and difficulty with ADLs due to pain.  She is left-hand dominant.  She has difficulty lifting the arm at times due to pain.  Pain is waking her up at night most nights.  She denies any recent injuries.  She does note occasional radiation into the elbow..                ROS: All systems reviewed are negative as they relate to the chief complaint within the history of present illness.  Patient denies fevers or chills.  Assessment & Plan: Visit Diagnoses:  1. Impingement syndrome of left shoulder     Plan: Patient is a 87 year old female who presents complaining of left shoulder pain.  She has history of left shoulder impingement that has responded well to subacromial injection.  Last injection was September 2021.  She requests another injection.  Excellent rotator cuff strength on exam today.  Subacromial injection administered and patient tolerated the procedure well.  Follow-up as needed.  Follow-Up Instructions: No follow-ups on file.   Orders:  No orders of the defined types were placed in this encounter.  No orders of the defined types were placed in this encounter.     Procedures: Large Joint Inj: L subacromial bursa on 04/14/2020 11:17 AM Indications: diagnostic evaluation and pain Details: 18 G 1.5 in needle, posterior approach  Arthrogram: No  Medications: 9 mL  bupivacaine 0.5 %; 40 mg methylPREDNISolone acetate 40 MG/ML; 5 mL lidocaine 1 % Outcome: tolerated well, no immediate complications Procedure, treatment alternatives, risks and benefits explained, specific risks discussed. Consent was given by the patient. Immediately prior to procedure a time out was called to verify the correct patient, procedure, equipment, support staff and site/side marked as required. Patient was prepped and draped in the usual sterile fashion.       Clinical Data: No additional findings.  Objective: Vital Signs: There were no vitals taken for this visit.  Physical Exam:  Constitutional: Patient appears well-developed HEENT:  Head: Normocephalic Eyes:EOM are normal Neck: Normal range of motion Cardiovascular: Normal rate Pulmonary/chest: Effort normal Neurologic: Patient is alert Skin: Skin is warm Psychiatric: Patient has normal mood and affect  Ortho Exam: Ortho exam demonstrates left shoulder with well-preserved range of motion.  +5 motor strength of supraspinatus, infraspinatus, subscapularis of the left shoulder.  No tenderness over the [facility] joint.  Positive Neer and Hawkins impingement signs.  No tenderness throughout the axial cervical spine.  No pain with cervical spine range of motion.  Specialty Comments:  No specialty comments available.  Imaging: No results found.   PMFS History: Patient Active Problem List   Diagnosis Date Noted  . Acute pain of right knee 08/23/2016  . Pain in right ankle and joints of right foot 08/23/2016  . Pseudophakia of both  eyes 02/06/2014  . AMD (age related macular degeneration) 09/13/2011  . Nuclear cataract 09/13/2011  . PULMONARY NODULE 05/20/2010  . THYROID NODULE 10/31/2009  . VITAMIN D DEFICIENCY 10/30/2009  . ALLERGIC RHINITIS CAUSE UNSPECIFIED 10/30/2009  . ASTHMA 10/30/2009  . ESOPHAGEAL SPASM 10/30/2009  . GERD 10/30/2009  . HIATAL HERNIA 10/30/2009  . IBS 10/30/2009  . FUNCTIONAL DIARRHEA  10/30/2009  . LOW BACK PAIN SYNDROME 10/30/2009  . SCIATICA 10/30/2009  . INSOMNIA 10/30/2009   Past Medical History:  Diagnosis Date  . Allergic rhinitis   . Asthma   . GERD (gastroesophageal reflux disease)   . Hiatal hernia   . IBS (irritable bowel syndrome)   . Insomnia   . Lumbar spondylosis   . Multinodular thyroid     Family History  Problem Relation Age of Onset  . Emphysema Brother   . Emphysema Father     Past Surgical History:  Procedure Laterality Date  . APPENDECTOMY    . BILATERAL SALPINGOOPHORECTOMY     fibroids  . CHOLECYSTECTOMY    . FOOT SURGERY     right  . SHOULDER SURGERY    . TONSILLECTOMY AND ADENOIDECTOMY    . VESICOVAGINAL FISTULA CLOSURE W/ TAH     Social History   Occupational History  . Occupation: retired Runner, broadcasting/film/video  Tobacco Use  . Smoking status: Former Smoker    Packs/day: 1.00    Years: 8.00    Pack years: 8.00    Types: Cigarettes    Quit date: 03/23/1959    Years since quitting: 61.1  . Smokeless tobacco: Never Used  Substance and Sexual Activity  . Alcohol use: Yes  . Drug use: Not on file  . Sexual activity: Not on file

## 2020-04-14 DIAGNOSIS — M7542 Impingement syndrome of left shoulder: Secondary | ICD-10-CM | POA: Diagnosis not present

## 2020-04-14 MED ORDER — METHYLPREDNISOLONE ACETATE 40 MG/ML IJ SUSP
40.0000 mg | INTRAMUSCULAR | Status: AC | PRN
  Administered 2020-04-14: 40 mg via INTRA_ARTICULAR

## 2020-04-14 MED ORDER — BUPIVACAINE HCL 0.5 % IJ SOLN
9.0000 mL | INTRAMUSCULAR | Status: AC | PRN
  Administered 2020-04-14: 9 mL via INTRA_ARTICULAR

## 2020-04-14 MED ORDER — LIDOCAINE HCL 1 % IJ SOLN
5.0000 mL | INTRAMUSCULAR | Status: AC | PRN
  Administered 2020-04-14: 5 mL

## 2020-04-30 ENCOUNTER — Telehealth: Payer: Self-pay | Admitting: Orthopedic Surgery

## 2020-04-30 NOTE — Telephone Encounter (Signed)
Pt called stating she had a cortisone injection in her shoulder and in past it helped but this time it did absolutely nothing for her pain. Pt would like a CB to discuss anything else she can try to get rid of the pain  [phone]

## 2020-04-30 NOTE — Telephone Encounter (Signed)
Okay for OTC medications (Tylenol with occasional Aleve use if she doesn't have any history of kidney disease, or heart attack/stroke). Can try OTC voltaren gel which may be beneficial as well.  If no help, can come in for re-eval

## 2020-04-30 NOTE — Telephone Encounter (Signed)
See below. Can you advise on any other suggestions?

## 2020-05-01 NOTE — Telephone Encounter (Signed)
Tried calling. No answer. LMVM for patient

## 2020-06-16 ENCOUNTER — Ambulatory Visit: Payer: Medicare Other | Admitting: Podiatry

## 2020-06-16 ENCOUNTER — Other Ambulatory Visit: Payer: Self-pay

## 2020-06-16 ENCOUNTER — Encounter: Payer: Self-pay | Admitting: Podiatry

## 2020-06-16 DIAGNOSIS — Q828 Other specified congenital malformations of skin: Secondary | ICD-10-CM

## 2020-06-16 DIAGNOSIS — M7741 Metatarsalgia, right foot: Secondary | ICD-10-CM

## 2020-06-16 NOTE — Progress Notes (Signed)
  Subjective:  Patient ID: Kristen Hart, female    DOB: Sep 29, 1927,  MRN: [id number]  Chief Complaint  Patient presents with  . Callouses    Right foot    87 y.o. female presents with the above complaint. History confirmed with patient.   Objective:  Physical Exam: warm, good capillary refill, no trophic changes or ulcerative lesions, normal DP and PT pulses and normal sensory exam.  Right Foot: Submetatarsal 1 callus  Assessment:   1. Porokeratosis   2. Metatarsalgia of right foot      Plan:  Patient was evaluated and treated and all questions answered.  All symptomatic hyperkeratoses were safely debrided with a sterile #15 blade to patient's level of comfort without incident. We discussed preventative and palliative care of these lesions including supportive and accommodative shoegear, padding, prefabricated and custom molded accommodative orthoses, use of a pumice stone and lotions/creams daily. Recommended urea cream  Return if symptoms worsen or fail to improve, for callus care.

## 2020-09-17 ENCOUNTER — Ambulatory Visit (INDEPENDENT_AMBULATORY_CARE_PROVIDER_SITE_OTHER): Payer: Medicare Other | Admitting: Orthopedic Surgery

## 2020-09-17 ENCOUNTER — Other Ambulatory Visit: Payer: Self-pay

## 2020-09-17 ENCOUNTER — Ambulatory Visit: Payer: Self-pay

## 2020-09-17 DIAGNOSIS — M25512 Pain in left shoulder: Secondary | ICD-10-CM

## 2020-09-17 DIAGNOSIS — G8929 Other chronic pain: Secondary | ICD-10-CM | POA: Diagnosis not present

## 2020-09-17 DIAGNOSIS — M7542 Impingement syndrome of left shoulder: Secondary | ICD-10-CM | POA: Diagnosis not present

## 2020-09-21 NOTE — Progress Notes (Signed)
Office Visit Note   Patient: Kristen Hart           Date of Birth: 1927/08/26           MRN: [id number] Visit Date: 09/17/2020 Requested by: Juluis Rainier, MD [address] [place],  [place] [address] PCP: Juluis Rainier, MD  Subjective: Chief Complaint  Patient presents with   Left Shoulder - Pain    HPI: Kristen Hart is a 87 y.o. female who presents to the office complaining of left shoulder pain.  Patient complains that her left shoulder feels "not good".  It is keeping her up at night.  She wakes with pain.  She has a history of receiving injections in the left shoulder for moderate to severe bursitis in the subacromial space that typically provide lasting relief for several months.  Last injection in January 2022 only provided about 3 to 4 days of relief.  She feels like she cannot trust the arm and lift things with the arm.  She denies any mechanical symptoms in the left arm.  She is using her right arm more more to compensate.  She has left hand dominant.  She localizes most of her pain to the lateral aspect of the shoulder with radiation down to the elbow.  She has some neck pain but none more than she typically has.  No history of scapular pain.  She feels like she is about to drop objects on occasion.  She has been taking Tylenol with some relief.  She is also doing exercises at [facility] burn where she is a resident..                ROS: All systems reviewed are negative as they relate to the chief complaint within the history of present illness.  Patient denies fevers or chills.  Assessment & Plan: Visit Diagnoses:  1. Chronic left shoulder pain     Plan: Patient is a 87 year old female who presents complaining of left shoulder pain.  She has history of left shoulder pain and last subacromial injection in January provided only 3 to 4 days of relief which is much worse than the several months of relief she typically gets.  Last set of radiographs were in 2020  so new radiographs were obtained today but show no significant progression of any glenohumeral, acromioclavicular arthritis or any acromiohumeral interval narrowing.  Given the loss of passive motion on the left side compared with the right side, conceivable that she could have some glenohumeral joint arthritis that is not evident on radiographs.  Plan to try left glenohumeral injection today to see if this will provide more relief than the recent subacromial injection.  Follow-up as needed if little to no improvement.  Patient agreed with plan.  Follow-Up Instructions: No follow-ups on file.   Orders:  Orders Placed This Encounter  Procedures   XR Shoulder Left   No orders of the defined types were placed in this encounter.     Procedures: Large Joint Inj: L glenohumeral on 09/17/2020 12:51 PM Indications: diagnostic evaluation and pain Details: 18 G 1.5 in needle, posterior approach  Arthrogram: No  Medications: 9 mL bupivacaine 0.5 %; 40 mg methylPREDNISolone acetate 40 MG/ML; 5 mL lidocaine 1 % Outcome: tolerated well, no immediate complications Procedure, treatment alternatives, risks and benefits explained, specific risks discussed. Consent was given by the patient. Immediately prior to procedure a time out was called to verify the correct patient, procedure, equipment, support staff and site/side  marked as required. Patient was prepped and draped in the usual sterile fashion.      Clinical Data: No additional findings.  Objective: Vital Signs: There were no vitals taken for this visit.  Physical Exam:  Constitutional: Patient appears well-developed HEENT:  Head: Normocephalic Eyes:EOM are normal Neck: Normal range of motion Cardiovascular: Normal rate Pulmonary/chest: Effort normal Neurologic: Patient is alert Skin: Skin is warm Psychiatric: Patient has normal mood and affect  Ortho Exam: Ortho exam demonstrates left shoulder with 40 degrees external rotation, 80  degrees abduction, 120 degrees forward flexion.  This compared with the right shoulder with 50 degrees external rotation, 100 degrees abduction, 165 degrees forward flexion.  Excellent rotator cuff strength of both shoulders comparative to each other.  No tenderness over the [facility] joint asymmetrically.  No significant crepitus noted with passive motion of the shoulder.  Negative belly press test.  Negative drop arm test.  Negative Hornblower sign.  Mild tenderness over the bicipital groove of the left shoulder.  Specialty Comments:  No specialty comments available.  Imaging: No results found.   PMFS History: Patient Active Problem List   Diagnosis Date Noted   Acute pain of right knee 08/23/2016   Pain in right ankle and joints of right foot 08/23/2016   Pseudophakia of both eyes 02/06/2014   AMD (age related macular degeneration) 09/13/2011   Nuclear cataract 09/13/2011   PULMONARY NODULE 05/20/2010   THYROID NODULE 10/31/2009   VITAMIN D DEFICIENCY 10/30/2009   ALLERGIC RHINITIS CAUSE UNSPECIFIED 10/30/2009   ASTHMA 10/30/2009   ESOPHAGEAL SPASM 10/30/2009   GERD 10/30/2009   HIATAL HERNIA 10/30/2009   IBS 10/30/2009   FUNCTIONAL DIARRHEA 10/30/2009   LOW BACK PAIN SYNDROME 10/30/2009   SCIATICA 10/30/2009   INSOMNIA 10/30/2009   Past Medical History:  Diagnosis Date   Allergic rhinitis    Asthma    GERD (gastroesophageal reflux disease)    Hiatal hernia    IBS (irritable bowel syndrome)    Insomnia    Lumbar spondylosis    Multinodular thyroid     Family History  Problem Relation Age of Onset   Emphysema Brother    Emphysema Father     Past Surgical History:  Procedure Laterality Date   APPENDECTOMY     BILATERAL SALPINGOOPHORECTOMY     fibroids   CHOLECYSTECTOMY     FOOT SURGERY     right   SHOULDER SURGERY     TONSILLECTOMY AND ADENOIDECTOMY     VESICOVAGINAL FISTULA CLOSURE W/ TAH     Social History   Occupational History   Occupation: retired Runner, broadcasting/film/video   Tobacco Use   Smoking status: Former    Packs/day: 1.00    Years: 8.00    Pack years: 8.00    Types: Cigarettes    Quit date: 03/23/1959    Years since quitting: 61.5   Smokeless tobacco: Never  Substance and Sexual Activity   Alcohol use: Yes   Drug use: Not on file   Sexual activity: Not on file

## 2020-09-22 MED ORDER — LIDOCAINE HCL 1 % IJ SOLN
5.0000 mL | INTRAMUSCULAR | Status: AC | PRN
  Administered 2020-09-17: 5 mL

## 2020-09-22 MED ORDER — METHYLPREDNISOLONE ACETATE 40 MG/ML IJ SUSP
40.0000 mg | INTRAMUSCULAR | Status: AC | PRN
  Administered 2020-09-17: 40 mg via INTRA_ARTICULAR

## 2020-09-22 MED ORDER — BUPIVACAINE HCL 0.5 % IJ SOLN
9.0000 mL | INTRAMUSCULAR | Status: AC | PRN
  Administered 2020-09-17: 9 mL via INTRA_ARTICULAR

## 2021-03-30 ENCOUNTER — Ambulatory Visit: Payer: Medicare Other | Admitting: Orthopedic Surgery

## 2021-04-13 ENCOUNTER — Other Ambulatory Visit: Payer: Self-pay

## 2021-04-13 ENCOUNTER — Encounter: Payer: Self-pay | Admitting: Orthopedic Surgery

## 2021-04-13 ENCOUNTER — Ambulatory Visit: Payer: Medicare Other | Admitting: Surgical

## 2021-04-13 DIAGNOSIS — M19012 Primary osteoarthritis, left shoulder: Secondary | ICD-10-CM | POA: Diagnosis not present

## 2021-04-13 MED ORDER — BUPIVACAINE HCL 0.5 % IJ SOLN
9.0000 mL | INTRAMUSCULAR | Status: AC | PRN
  Administered 2021-04-13: 9 mL via INTRA_ARTICULAR

## 2021-04-13 MED ORDER — LIDOCAINE HCL 1 % IJ SOLN
5.0000 mL | INTRAMUSCULAR | Status: AC | PRN
  Administered 2021-04-13: 5 mL

## 2021-04-13 MED ORDER — METHYLPREDNISOLONE ACETATE 40 MG/ML IJ SUSP
40.0000 mg | INTRAMUSCULAR | Status: AC | PRN
  Administered 2021-04-13: 40 mg via INTRA_ARTICULAR

## 2021-04-13 NOTE — Progress Notes (Signed)
Office Visit Note   Patient: Kristen Hart           Date of Birth: 1928/02/01           MRN: [id number] Visit Date: 04/13/2021 Requested by: No referring provider defined for this encounter. PCP: Cleatis Polka., MD  Subjective: Chief Complaint  Patient presents with   Left Shoulder - Pain    Wants repeat cortisone injection    HPI: Kristen Hart is a 88 y.o. female who presents to the office complaining of left shoulder pain.  Patient is left-handed.  She feels weak in her left shoulder at times.  She describes a dull ache in the left shoulder that radiates down to the elbow.  Does not wake her up at night.  She lives at [facility].  She does have history of left-sided rotator cuff surgery.  She cannot lay on her left shoulder for too long.  She had previous injection back in June of last year that provided good relief and her shoulder pain is significantly improved compared with how it was in early 2022..                ROS: All systems reviewed are negative as they relate to the chief complaint within the history of present illness.  Patient denies fevers or chills.  Assessment & Plan: Visit Diagnoses: No diagnosis found.  Plan: Patient is a 88 year old female who presents for evaluation of left shoulder pain.  Had great relief from left shoulder injection in June of last year.  Overall her pain is still improved compared with how was prior to that injection.  She would like to try another injection to see if this will help with the more recent dull aching pain that she has been experiencing.  She does have a little bit of left-sided rotator cuff weakness but nothing that seems to give her any functional difficulties.  She does have history of left rotator cuff surgery according to her.  Left shoulder glenohumeral injection administered and patient tolerated the procedure well.  Plan for her to follow-up with the office as needed.  She can receive these injections every 3  to 4 months as needed.  Follow-Up Instructions: No follow-ups on file.   Orders:  No orders of the defined types were placed in this encounter.  No orders of the defined types were placed in this encounter.     Procedures: Large Joint Inj: L glenohumeral on 04/13/2021 6:01 PM Indications: diagnostic evaluation and pain Details: 18 G 1.5 in needle, posterior approach  Arthrogram: No  Medications: 9 mL bupivacaine 0.5 %; 40 mg methylPREDNISolone acetate 40 MG/ML; 5 mL lidocaine 1 % Outcome: tolerated well, no immediate complications Procedure, treatment alternatives, risks and benefits explained, specific risks discussed. Consent was given by the patient. Immediately prior to procedure a time out was called to verify the correct patient, procedure, equipment, support staff and site/side marked as required. Patient was prepped and draped in the usual sterile fashion.      Clinical Data: No additional findings.  Objective: Vital Signs: There were no vitals taken for this visit.  Physical Exam:  Constitutional: Patient appears well-developed HEENT:  Head: Normocephalic Eyes:EOM are normal Neck: Normal range of motion Cardiovascular: Normal rate Pulmonary/chest: Effort normal Neurologic: Patient is alert Skin: Skin is warm Psychiatric: Patient has normal mood and affect  Ortho Exam: Ortho exam demonstrates left shoulder with 45 degrees external rotation, 80 degrees abduction, 145 degrees  forward flexion.  Axillary nerve intact with deltoid firing.  5 -/5 infraspinatus and supraspinatus strength of the left shoulder compared with 5/5 strength on the right shoulder.  5/5 subscapularis strength bilaterally.  No masses or lymphadenopathy noted.  Specialty Comments:  No specialty comments available.  Imaging: No results found.   PMFS History: Patient Active Problem List   Diagnosis Date Noted   Acute pain of right knee 08/23/2016   Pain in right ankle and joints of right  foot 08/23/2016   Pseudophakia of both eyes 02/06/2014   AMD (age related macular degeneration) 09/13/2011   Nuclear cataract 09/13/2011   PULMONARY NODULE 05/20/2010   THYROID NODULE 10/31/2009   VITAMIN D DEFICIENCY 10/30/2009   ALLERGIC RHINITIS CAUSE UNSPECIFIED 10/30/2009   ASTHMA 10/30/2009   ESOPHAGEAL SPASM 10/30/2009   GERD 10/30/2009   HIATAL HERNIA 10/30/2009   IBS 10/30/2009   FUNCTIONAL DIARRHEA 10/30/2009   LOW BACK PAIN SYNDROME 10/30/2009   SCIATICA 10/30/2009   INSOMNIA 10/30/2009   Past Medical History:  Diagnosis Date   Allergic rhinitis    Asthma    GERD (gastroesophageal reflux disease)    Hiatal hernia    IBS (irritable bowel syndrome)    Insomnia    Lumbar spondylosis    Multinodular thyroid     Family History  Problem Relation Age of Onset   Emphysema Brother    Emphysema Father     Past Surgical History:  Procedure Laterality Date   APPENDECTOMY     BILATERAL SALPINGOOPHORECTOMY     fibroids   CHOLECYSTECTOMY     FOOT SURGERY     right   SHOULDER SURGERY     TONSILLECTOMY AND ADENOIDECTOMY     VESICOVAGINAL FISTULA CLOSURE W/ TAH     Social History   Occupational History   Occupation: retired Runner, broadcasting/film/video  Tobacco Use   Smoking status: Former    Packs/day: 1.00    Years: 8.00    Pack years: 8.00    Types: Cigarettes    Quit date: 03/23/1959    Years since quitting: 62.1   Smokeless tobacco: Never  Substance and Sexual Activity   Alcohol use: Yes   Drug use: Not on file   Sexual activity: Not on file

## 2023-09-30 ENCOUNTER — Telehealth (HOSPITAL_COMMUNITY): Payer: Self-pay

## 2023-09-30 NOTE — Telephone Encounter (Signed)
 Auth Submission: NO AUTH NEEDED Site of care: Site of care: [facility] Payer: [employer] Medication & CPT/J Code(s) submitted: Reclast (Zolendronic acid) [id number] Diagnosis Code:  Route of submission (phone, fax, portal):  Phone # Fax # Auth type: Buy/Bill HB Units/visits requested: 5mg  x 1 dose Reference number:  Approval from: 09/30/23 to 03/21/24

## 2023-10-17 ENCOUNTER — Encounter (HOSPITAL_COMMUNITY)
